# Patient Record
Sex: Female | Born: 1990
Health system: Southern US, Community
[De-identification: ages and names within clinical notes are randomized; demographics above are authoritative.]

## PROBLEM LIST (undated history)

## (undated) ENCOUNTER — Inpatient Hospital Stay (HOSPITAL_COMMUNITY): Payer: Self-pay

## (undated) ENCOUNTER — Emergency Department (HOSPITAL_BASED_OUTPATIENT_CLINIC_OR_DEPARTMENT_OTHER): Payer: Self-pay

## (undated) DIAGNOSIS — S0990XA Unspecified injury of head, initial encounter: Secondary | ICD-10-CM

## (undated) HISTORY — PX: BRAIN SURGERY: SHX531

---

## 2014-12-23 DIAGNOSIS — S0990XA Unspecified injury of head, initial encounter: Secondary | ICD-10-CM

## 2014-12-23 HISTORY — PX: BRAIN SURGERY: SHX531

## 2014-12-23 HISTORY — DX: Unspecified injury of head, initial encounter: S09.90XA

## 2016-02-14 ENCOUNTER — Encounter (HOSPITAL_BASED_OUTPATIENT_CLINIC_OR_DEPARTMENT_OTHER): Payer: Self-pay | Admitting: Emergency Medicine

## 2016-02-14 ENCOUNTER — Emergency Department (HOSPITAL_BASED_OUTPATIENT_CLINIC_OR_DEPARTMENT_OTHER)
Admission: EM | Admit: 2016-02-14 | Discharge: 2016-02-14 | Disposition: A | Discharge status: Home / Self Care | Payer: Self-pay | Attending: Emergency Medicine | Admitting: Emergency Medicine

## 2016-02-14 ENCOUNTER — Emergency Department (HOSPITAL_BASED_OUTPATIENT_CLINIC_OR_DEPARTMENT_OTHER): Payer: Self-pay

## 2016-02-14 DIAGNOSIS — Z8781 Personal history of (healed) traumatic fracture: Secondary | ICD-10-CM | POA: Insufficient documentation

## 2016-02-14 DIAGNOSIS — R1032 Left lower quadrant pain: Secondary | ICD-10-CM | POA: Insufficient documentation

## 2016-02-14 DIAGNOSIS — Z3202 Encounter for pregnancy test, result negative: Secondary | ICD-10-CM | POA: Insufficient documentation

## 2016-02-14 DIAGNOSIS — R1030 Lower abdominal pain, unspecified: Secondary | ICD-10-CM

## 2016-02-14 DIAGNOSIS — M545 Low back pain: Secondary | ICD-10-CM | POA: Insufficient documentation

## 2016-02-14 DIAGNOSIS — R1110 Vomiting, unspecified: Secondary | ICD-10-CM | POA: Insufficient documentation

## 2016-02-14 DIAGNOSIS — N939 Abnormal uterine and vaginal bleeding, unspecified: Secondary | ICD-10-CM | POA: Insufficient documentation

## 2016-02-14 HISTORY — DX: Unspecified injury of head, initial encounter: S09.90XA

## 2016-02-14 LAB — URINE MICROSCOPIC-ADD ON

## 2016-02-14 LAB — WET PREP, GENITAL
Clue Cells Wet Prep HPF POC: NONE SEEN
Sperm: NONE SEEN
Trich, Wet Prep: NONE SEEN
Yeast Wet Prep HPF POC: NONE SEEN

## 2016-02-14 LAB — URINALYSIS, ROUTINE W REFLEX MICROSCOPIC
Bilirubin Urine: NEGATIVE
Glucose, UA: NEGATIVE mg/dL
Ketones, ur: NEGATIVE mg/dL
Nitrite: NEGATIVE
Protein, ur: NEGATIVE mg/dL
Specific Gravity, Urine: 1.024 (ref 1.005–1.030)
pH: 5.5 (ref 5.0–8.0)

## 2016-02-14 LAB — PREGNANCY, URINE: Preg Test, Ur: NEGATIVE

## 2016-02-14 MED ORDER — HYDROCODONE-ACETAMINOPHEN 5-325 MG PO TABS
1.0000 | ORAL_TABLET | Freq: Once | ORAL | Status: AC
  Administered 2016-02-14: 1 via ORAL
  Filled 2016-02-14: qty 1

## 2016-02-14 MED ORDER — HYDROCODONE-ACETAMINOPHEN 5-325 MG PO TABS: 1.0000 | ORAL_TABLET | Freq: Four times a day (QID) | ORAL | Status: DC | PRN

## 2016-02-14 MED FILL — HYDROCODON-APAP 5-325: 5-325 | 2 days supply | Qty: 10 | Fill #0

## 2016-02-14 NOTE — ED Notes (Signed)
Lower abdominal pain with lower back pain since this am.  Vomited x 1.  No fever.  Pt on period.

## 2016-02-14 NOTE — ED Provider Notes (Signed)
CSN: [id number]     Arrival date & time 02/14/16  1018 History   First MD Initiated Contact with Patient 02/14/16 1109     Chief Complaint  Patient presents with  . Abdominal Pain     (Consider location/radiation/quality/duration/timing/severity/associated sxs/prior Treatment) HPI  25 year old female presents with acute left-sided lower abdominal pain and low back pain since this morning. Feels like a cramping sensation. She started her menstrual cycle this morning at the same time. His pain feels similar in quality but more severe than typical period. Patient denies any fevers. Vomited once. No urinary symptoms.  Past Medical History  Diagnosis Date  . Head trauma    Past Surgical History  Procedure Laterality Date  . Brain surgery     No family history on file. Social History  Substance Use Topics  . Smoking status: Never Smoker   . Smokeless tobacco: None  . Alcohol Use: None   OB History    No data available     Review of Systems  Constitutional: Negative for fever.  Gastrointestinal: Positive for vomiting and abdominal pain. Negative for nausea.  Genitourinary: Positive for vaginal bleeding. Negative for dysuria and vaginal discharge.  Musculoskeletal: Positive for back pain.  All other systems reviewed and are negative.     Allergies  Review of patient's allergies indicates no known allergies.  Home Medications   Prior to Admission medications   Not on File   BP 127/89 mmHg  Pulse 69  Temp(Src) 99 F (37.2 C) (Oral)  Resp 18  Ht  (1.626 m)  Wt 160 lb (72.576 kg)  BMI 27.45 kg/m2  SpO2 100%  LMP 02/13/2016 Physical Exam  Constitutional: She is oriented to person, place, and time. She appears well-developed and well-nourished.  HENT:  Head: Normocephalic and atraumatic.  Right Ear: External ear normal.  Left Ear: External ear normal.  Nose: Nose normal.  Eyes: Right eye exhibits no discharge. Left eye exhibits no discharge.  Cardiovascular:  Normal rate, regular rhythm and normal heart sounds.   Pulmonary/Chest: Effort normal and breath sounds normal.  Abdominal: Soft. There is tenderness (mild) in the left lower quadrant. There is no CVA tenderness.  Genitourinary: Uterus is not enlarged. Cervix exhibits no motion tenderness. Right adnexum displays no mass and no tenderness. Left adnexum displays tenderness. Left adnexum displays no mass. There is bleeding in the vagina.  Neurological: She is alert and oriented to person, place, and time.  Skin: Skin is warm and dry.  Nursing note and vitals reviewed.   ED Course  Procedures (including critical care time) Labs Review Labs Reviewed  WET PREP, GENITAL - Abnormal; Notable for the following:    WBC, Wet Prep HPF POC MODERATE (*)    All other components within normal limits  URINALYSIS, ROUTINE W REFLEX MICROSCOPIC (NOT AT [facility]) - Abnormal; Notable for the following:    Color, Urine AMBER (*)    APPearance CLOUDY (*)    Hgb urine dipstick LARGE (*)    Leukocytes, UA SMALL (*)    All other components within normal limits  URINE MICROSCOPIC-ADD ON - Abnormal; Notable for the following:    Squamous Epithelial / LPF 0-5 (*)    Bacteria, UA FEW (*)    All other components within normal limits  PREGNANCY, URINE  GC/CHLAMYDIA PROBE AMP (Bessie) NOT AT [facility]    Imaging Review US Transvaginal Non-ob  02/14/2016  CLINICAL DATA:  25 year old female with left lower quadrant, pelvic pain since this morning.  Started her period yesterday. Initial encounter. EXAM: TRANSABDOMINAL AND TRANSVAGINAL ULTRASOUND OF PELVIS DOPPLER ULTRASOUND OF OVARIES TECHNIQUE: Both transabdominal and transvaginal ultrasound examinations of the pelvis were performed. Transabdominal technique was performed for global imaging of the pelvis including uterus, ovaries, adnexal regions, and pelvic cul-de-sac. It was necessary to proceed with endovaginal exam following the transabdominal exam to visualize the  ovaries. Color and duplex Doppler ultrasound was utilized to evaluate blood flow to the ovaries. COMPARISON:  None. FINDINGS: Uterus Measurements: 6.1 x 2.9 x 4.2 cm. No fibroids or other mass visualized. Endometrium Thickness: 7 mm.  No focal abnormality visualized. Right ovary Measurements: 4.1 x 2.7 x 2.4 cm. 18 mm simple appearing cyst/dominant follicle. Normal appearance/no adnexal mass. Left ovary Measurements: 3.9 x 2.3 x 2.3 cm. Normal appearance/no adnexal mass. Pulsed Doppler evaluation of both ovaries demonstrates normal low-resistance arterial and venous waveforms. Other findings No abnormal free fluid. IMPRESSION: Normal, physiologic ultrasound appearance of the pelvis. No evidence of ovarian torsion. Electronically Signed   By: Odessa Fleming M.D.   On: 02/14/2016 12:49   US Pelvis Complete  02/14/2016  CLINICAL DATA:  25 year old female with left lower quadrant, pelvic pain since this morning. Started her period yesterday. Initial encounter. EXAM: TRANSABDOMINAL AND TRANSVAGINAL ULTRASOUND OF PELVIS DOPPLER ULTRASOUND OF OVARIES TECHNIQUE: Both transabdominal and transvaginal ultrasound examinations of the pelvis were performed. Transabdominal technique was performed for global imaging of the pelvis including uterus, ovaries, adnexal regions, and pelvic cul-de-sac. It was necessary to proceed with endovaginal exam following the transabdominal exam to visualize the ovaries. Color and duplex Doppler ultrasound was utilized to evaluate blood flow to the ovaries. COMPARISON:  None. FINDINGS: Uterus Measurements: 6.1 x 2.9 x 4.2 cm. No fibroids or other mass visualized. Endometrium Thickness: 7 mm.  No focal abnormality visualized. Right ovary Measurements: 4.1 x 2.7 x 2.4 cm. 18 mm simple appearing cyst/dominant follicle. Normal appearance/no adnexal mass. Left ovary Measurements: 3.9 x 2.3 x 2.3 cm. Normal appearance/no adnexal mass. Pulsed Doppler evaluation of both ovaries demonstrates normal  low-resistance arterial and venous waveforms. Other findings No abnormal free fluid. IMPRESSION: Normal, physiologic ultrasound appearance of the pelvis. No evidence of ovarian torsion. Electronically Signed   By: Odessa Fleming M.D.   On: 02/14/2016 12:49   [place] Art/ven Flow Abd Pelv Doppler Limited  02/14/2016  CLINICAL DATA:  25 year old female with left lower quadrant, pelvic pain since this morning. Started her period yesterday. Initial encounter. EXAM: TRANSABDOMINAL AND TRANSVAGINAL ULTRASOUND OF PELVIS DOPPLER ULTRASOUND OF OVARIES TECHNIQUE: Both transabdominal and transvaginal ultrasound examinations of the pelvis were performed. Transabdominal technique was performed for global imaging of the pelvis including uterus, ovaries, adnexal regions, and pelvic cul-de-sac. It was necessary to proceed with endovaginal exam following the transabdominal exam to visualize the ovaries. Color and duplex Doppler ultrasound was utilized to evaluate blood flow to the ovaries. COMPARISON:  None. FINDINGS: Uterus Measurements: 6.1 x 2.9 x 4.2 cm. No fibroids or other mass visualized. Endometrium Thickness: 7 mm.  No focal abnormality visualized. Right ovary Measurements: 4.1 x 2.7 x 2.4 cm. 18 mm simple appearing cyst/dominant follicle. Normal appearance/no adnexal mass. Left ovary Measurements: 3.9 x 2.3 x 2.3 cm. Normal appearance/no adnexal mass. Pulsed Doppler evaluation of both ovaries demonstrates normal low-resistance arterial and venous waveforms. Other findings No abnormal free fluid. IMPRESSION: Normal, physiologic ultrasound appearance of the pelvis. No evidence of ovarian torsion. Electronically Signed   By: Odessa Fleming M.D.   On: 02/14/2016 12:49   I have  personally reviewed and evaluated these images and lab results as part of my medical decision-making.   EKG Interpretation None      MDM   Final diagnoses:  Lower abdominal pain    Patient's lower abdominal pain is most likely related to her menstrual  cycle. No signs of ovarian torsion or ovarian cyst or rupture. Her pain is reproducible on GU exam. Very low suspicion for other abdominal cause of her symptoms such as diverticulitis, renal stone, obstruction, or appendicitis. Has blood in urine but that appears to be from vaginal bleeding. Exam not c/w PID. Vomited once but the nausea has now resolved. Will recommend following up with her GYN and will treat with oral pain control. Discussed strict return precautions.    Pricilla Loveless, MD 02/14/16 [phone]

## 2016-02-15 LAB — GC/CHLAMYDIA PROBE AMP (~~LOC~~) NOT AT ARMC
Chlamydia: NEGATIVE
Neisseria Gonorrhea: NEGATIVE

## 2017-12-23 NOTE — L&D Delivery Note (Addendum)
Delivery Note Pushed very effectively and delivered quickly. Stayed in tub throughout, well controlled.  FOB assisted with delivery, but I was hands-on.  At 6:56 PM a viable and healthy female was delivered via Vaginal, Spontaneous (Presentation:OA ).  APGAR: 8, 9; weight  .   Placenta status:Got patient out of bed for placenta which delivered spontaneously and grossly intact with 3V  Cord:  with the following complications: Nuchal cord x 2.    Anesthesia:  Local for repair.   Episiotomy: None Lacerations: 2nd degree perineal and small left labial Suture Repair: 3.0 vicryl rapide Est. Blood Loss (mL):  300  Mom to postpartum.  Baby to Couplet care / Skin to Skin.  Wynelle BourgeoisMarie [place] 06/29/2018, 7:52 PM  Please schedule this patient for Postpartum visit in: 4 weeks with the following provider: Any provider For C/S patients schedule nurse incision check in weeks 2 weeks: no Low risk pregnancy complicated by: none Delivery mode:  SVD Anticipated Birth Control:  other/unsure PP Procedures needed: none  Schedule Integrated BH visit: no

## 2018-01-13 ENCOUNTER — Encounter: Payer: Self-pay | Admitting: Advanced Practice Midwife

## 2018-01-13 ENCOUNTER — Ambulatory Visit (INDEPENDENT_AMBULATORY_CARE_PROVIDER_SITE_OTHER): Payer: Medicaid Other | Admitting: Advanced Practice Midwife

## 2018-01-13 ENCOUNTER — Other Ambulatory Visit (HOSPITAL_COMMUNITY)
Admission: RE | Admit: 2018-01-13 | Discharge: 2018-01-13 | Disposition: A | Discharge status: Home / Self Care | Payer: Medicaid Other | Source: Ambulatory Visit | Attending: Advanced Practice Midwife | Admitting: Advanced Practice Midwife

## 2018-01-13 VITALS — BP 112/71 | HR 85 | Wt 136.0 lb

## 2018-01-13 DIAGNOSIS — Z3490 Encounter for supervision of normal pregnancy, unspecified, unspecified trimester: Secondary | ICD-10-CM | POA: Insufficient documentation

## 2018-01-13 DIAGNOSIS — Z3400 Encounter for supervision of normal first pregnancy, unspecified trimester: Secondary | ICD-10-CM

## 2018-01-13 DIAGNOSIS — Z3689 Encounter for other specified antenatal screening: Secondary | ICD-10-CM | POA: Diagnosis not present

## 2018-01-13 DIAGNOSIS — Z967 Presence of other bone and tendon implants: Secondary | ICD-10-CM | POA: Insufficient documentation

## 2018-01-13 DIAGNOSIS — L02229 Furuncle of trunk, unspecified: Secondary | ICD-10-CM

## 2018-01-13 DIAGNOSIS — Z3402 Encounter for supervision of normal first pregnancy, second trimester: Secondary | ICD-10-CM | POA: Diagnosis not present

## 2018-01-13 MED ORDER — PRENATAL VITAMINS 0.8 MG PO TABS: 1.0000 | ORAL_TABLET | Freq: Every day | ORAL | 12 refills | Status: DC

## 2018-01-13 NOTE — Progress Notes (Signed)
  Subjective:    Lisa Levy is a G1P0 6962w0d being seen today for her first obstetrical visit.  Her obstetrical history is significant for Primigravida, Desires Waterbirth. Patient does intend to breast feed. Pregnancy history fully reviewed.  Patient reports no complaints.  Vitals:   01/13/18 0855  BP: 112/71  Pulse: 85  Weight: 136 lb (61.7 kg)    HISTORY: OB History  Gravida Para Term Preterm AB Living  1            SAB TAB Ectopic Multiple Live Births               # Outcome Date GA Lbr Len/2nd Weight Sex Delivery Anes PTL Lv  1 Current              Past Medical History:  Diagnosis Date  . Head trauma    Past Surgical History:  Procedure Laterality Date  . BRAIN SURGERY     Family History  Problem Relation Age of Onset  . Cancer Paternal Grandmother   . Diabetes Neg Hx   . Hypertension Neg Hx   . Stroke Neg Hx      Exam    Uterus:     Pelvic Exam:    Perineum: Normal Perineum   Vulva: Bartholin's, Urethra, Skene's normal, Has draining lesion on right labia x 3 weeks, gets red/swollen then drains   Now is 3cm long, .5cm wide, small opening at bottom draining serous fluid, no erethema or induration, superficial   Vagina:  normal discharge   pH:    Cervix: no bleeding following Pap and nulliparous appearance   Adnexa: no mass, fullness, tenderness   Bony Pelvis: gynecoid  System: Breast:  normal appearance, no masses or tenderness, No nipple retraction or dimpling, No axillary or supraclavicular adenopathy, Normal to palpation without dominant masses   Skin: normal coloration and turgor, no rashes    Neurologic: oriented, normal mood, grossly non-focal   Extremities: normal strength, tone, and muscle mass   HEENT neck supple with midline trachea   Mouth/Teeth mucous membranes moist, pharynx normal without lesions   Neck supple and no masses   Cardiovascular: regular rate and rhythm, no murmurs or gallops   Respiratory:  appears well, vitals normal, no  respiratory distress, acyanotic, normal RR, ear and throat exam is normal, neck free of mass or lymphadenopathy, chest clear, no wheezing, crepitations, rhonchi, normal symmetric air entry   Abdomen: soft, non-tender; bowel sounds normal; no masses,  no organomegaly   Urinary: urethral meatus normal      Assessment:    Pregnancy: G1P0 Patient Active Problem List   Diagnosis Date Noted  . Supervision of normal first pregnancy, antepartum 01/13/2018  . Metal plate in skull 08/65/784601/22/2019        Plan:    Routines reviewed Welcomed to practice. Discussed fellow, residents and students and how our team works together. Initial labs drawn. Prenatal vitamins. Problem list reviewed and updated. Genetic Screening discussed Integrated Screen and Quad Screen: declined.  Ultrasound discussed; fetal survey: ordered.  Follow up in 4 weeks. 50% of 30 min visit spent on counseling and coordination of care.  Discussed Babys[employer]s app Discussed waterbirth. Has taken class. Need to copy certificate.  Discussed options for tub. Discussed doula support.   Wynelle BourgeoisMarie Recia Sons 01/13/2018

## 2018-01-13 NOTE — Progress Notes (Signed)
DATING AND VIABILITY SONOGRAM   Lisa Levy is a 27 y.o. year old G1P0 with LMP Patient's last menstrual period was 10/17/2017 (approximate). which would correlate to  4526w4d weeks gestation.  She has regular menstrual cycles.   She is here today for a confirmatory initial sonogram.    GESTATION: SINGLETON   FETAL ACTIVITY:          Heart rate         138          The fetus is active.    GESTATIONAL AGE AND  BIOMETRICS:  Gestational criteria: Estimated Date of Delivery: 07/24/18 by LMP now at 5226w4d  Previous Scans:0  Head circumferenc 13.9 cm 17-2 weeks  Femur length 1.88 vm 15-3 weeks                                                                               AVERAGE EGA(BY THIS SCAN):  16 weeks  WORKING EDD(2nd trimester ultrasound):  16 weeks. Patient will be schedule for anatomy ultrasound in two weeks.   Armandina StammerJennifer Howard 01/13/2018 9:15 AM

## 2018-01-13 NOTE — Patient Instructions (Signed)
Thinking About Doren Custard???  You must attend a Doren Custard class at [facility]  3rd Wednesday of every month from 7-9pm  [employer] by calling [phone] or online at [email or web address]  Bring [place] the certificate from the class  Waterbirth supplies needed for [employer] patients:  Our practice has a Heritage manager in a Box tub at the hospital that you can borrow  You will need to purchase an accessory kit that has all needed supplies through [employer] (  ) or online  Or you can purchase the supplies separately: o Single-use disposable tub liner for [employer] in a Box (REGULAR size) o New garden hose labeled "lead-free", "suitable for drinking water", "non-toxic" OR "water potable" o Garden hose to remove the dirty water o Electric drain pump to remove water (We recommend 792 gallon per hour or greater pump.)  o Fish net o Bathing suit top (optional) o Long-handled mirror (optional)  [employer] sells tubs for ~ $120 if you would rather purchase your own tub  [facility] [employer] [facility] (www.thelaborladies.com) $275 for tub rental/set-up & take down/kit   Things that would prevent you from having a waterbirth:  Premature, <37wks  Previous cesarean birth  Presence of thick meconium-stained fluid  Multiple gestation (Twins, triplets, etc.)  Uncontrolled diabetes  Hypertension  Heavy vaginal bleeding  Non-reassuring fetal heart rate  Active infection (MRSA, etc.)  If your labor has to be induced  Other risk issues identified by your obstetrical provider Thinking About Doren Custard???  You must attend a Doren Custard class at [facility]  3rd Wednesday of every month from 7-9pm  [employer] by calling [phone] or online at [email or web address]  Bring [place] the certificate from the class  Waterbirth supplies needed for [employer] patients:  Our practice has a Occupational psychologist in a Box tub at the hospital that you can borrow  You will need to purchase an accessory kit that has all needed supplies through [employer] (  ) or online  Or you can purchase the supplies separately: o Single-use disposable tub liner for [employer] in a Box (REGULAR size) o New garden hose labeled "lead-free", "suitable for drinking water", "non-toxic" OR "water potable" o Garden hose to remove the dirty water o Electric drain pump to remove water (We recommend 792 gallon per hour or greater pump.)  o Fish net o Bathing suit top (optional) o Long-handled mirror (optional)  [employer] sells tubs for ~ $120 if you would rather purchase your own tub  [facility] (www.thelaborladies.com) $275 for tub rental/set-up & take down/kit   Things that would prevent you from having a waterbirth:  Premature, <37wks  Previous cesarean birth  Presence of thick meconium-stained fluid  Multiple gestation (Twins, triplets, etc.)  Uncontrolled diabetes  Hypertension  Heavy vaginal bleeding  Non-reassuring fetal heart rate  Active infection (MRSA, etc.)  If your labor has to be induced  Other risk issues identified by your obstetrical provider  Considering Doren Custard? Guide for patients at [facility] [employer] [facility]  Why consider waterbirth?  . Gentle birth for babies . Less pain medicine used in labor . May allow for passive descent/less pushing . May reduce perineal tears  . More mobility and instinctive maternal position changes . Increased maternal relaxation . Reduced blood pressure in labor  Is waterbirth safe? What are the risks of infection, drowning or other complications?  . Infection: o Very low risk (3.7 % for tub vs 4.8% for bed) o  7 in 80 waterbirths with documented infection o Poorly cleaned equipment most common cause o Slightly lower group B strep transmission rate  . Drowning o Maternal:  - Very low risk    - Related to seizures or fainting o Newborn:  - Very low risk. No evidence of increased risk of respiratory problems in multiple large studies - Physiological protection from breathing under water - Avoid underwater birth if there are any fetal complications - Once baby's head is out of the water, keep it out.  . Birth complication o Some reports of cord trauma, but risk decreased by bringing baby to surface gradually o No evidence of increased risk of shoulder dystocia. Mothers can usually change positions faster in water than in a bed, possibly aiding the maneuvers to free the shoulder.   Am I a candidate for waterbirth?  Yes, if you are: . Full-term (37 weeks or greater)  . Have had an uncomplicated pregnancy and labor  No, if you have: Marland Kitchen Preterm birth less than 37 weeks . Thick, particulate meconium stained fluid . Maternal fever over 101 . Heavy bleeding or signs of placental abruption . Pre-eclampsia  . Any abnormal fetal heart rate pattern . Breech presentation . Twins  . Very large baby . Active communicable infection (this does NOT include group B strep) . Significant limitation to mobility  Please remember that birth is unpredictable. Under certain unforeseeable circumstances your provider may advise against giving birth in the tub. These decisions will be made on a case-by-case basis and with the safety of you and your baby as our highest priority.  Requirements for patients planning waterbirth  . Ask your midwife if you will be a candidate for waterbirth. . Attend the Barney Drain at [employer] at [phone] or [phone] for dates and times. The class is free and we strongly encourage you to bring your support person. You will receive a certificate of participation to show to your midwife or doctor. . Supplies o Waterbirth tub (NOT kiddie pool) o Architectural technologist pump to inflate tub (manual-foot  pump or electric) o New garden hose labeled "lead-free", "suitable for drinking water", "non-toxic" OR "water potable" o Faucet adaptor to attach hose to faucet         o Electric drain pump to remove water (We recommend 792 gallon per hour or greater pump.)  o Water thermometer (baby store / pool supplies) o Occupational psychologist top (optional) o Long-handled mirror (optional)   The above information was reviewed with the patient and she verbally consented and acknowledged the eligibility criteria as well as contraindications and procedures for both labor and birth. The patient also acknowledged that she has been informed that during the course of labor and birth unforeseen conditions may occur which may require her to leave the water. The patients agrees to follow the instructions from the nurse, nurse midwife and/or physician including getting out of the tub if deemed medically necessary.   Second Trimester of Pregnancy The second trimester is from week 13 through week 28, month 4 through 6. This is often the time in pregnancy that you feel your best. Often times, morning sickness has lessened or quit. You may have more energy, and you may get hungry more often. Your unborn baby (fetus) is growing rapidly. At the end of the sixth month, he or she is about 9 inches long and weighs about 1 pounds. You will likely feel the  baby move (quickening) between 18 and 20 weeks of pregnancy. Follow these instructions at home:  Avoid all smoking, herbs, and alcohol. Avoid drugs not approved by your doctor.  Do not use any tobacco products, including cigarettes, chewing tobacco, and electronic cigarettes. If you need help quitting, ask your doctor. You may get counseling or other support to help you quit.  Only take medicine as told by your doctor. Some medicines are safe and some are not during pregnancy.  Exercise only as told by your doctor. Stop exercising if you start having cramps.  Eat  regular, healthy meals.  Wear a good support bra if your breasts are tender.  Do not use hot tubs, steam rooms, or saunas.  Wear your seat belt when driving.  Avoid raw meat, uncooked cheese, and liter boxes and soil used by cats.  Take your prenatal vitamins.  Take 1500-2000 milligrams of calcium daily starting at the 20th week of pregnancy until you deliver your baby.  Try taking medicine that helps you poop (stool softener) as needed, and if your doctor approves. Eat more fiber by eating fresh fruit, vegetables, and whole grains. Drink enough fluids to keep your pee (urine) clear or pale yellow.  Take warm water baths (sitz baths) to soothe pain or discomfort caused by hemorrhoids. Use hemorrhoid cream if your doctor approves.  If you have puffy, bulging veins (varicose veins), wear support hose. Raise (elevate) your feet for 15 minutes, 3-4 times a day. Limit salt in your diet.  Avoid heavy lifting, wear low heals, and sit up straight.  Rest with your legs raised if you have leg cramps or low back pain.  Visit your dentist if you have not gone during your pregnancy. Use a soft toothbrush to brush your teeth. Be gentle when you floss.  You can have sex (intercourse) unless your doctor tells you not to.  Go to your doctor visits. Get help if:  You feel dizzy.  You have mild cramps or pressure in your lower belly (abdomen).  You have a nagging pain in your belly area.  You continue to feel sick to your stomach (nauseous), throw up (vomit), or have watery poop (diarrhea).  You have bad smelling fluid coming from your vagina.  You have pain with peeing (urination). Get help right away if:  You have a fever.  You are leaking fluid from your vagina.  You have spotting or bleeding from your vagina.  You have severe belly cramping or pain.  You lose or gain weight rapidly.  You have trouble catching your breath and have chest pain.  You notice sudden or extreme  puffiness (swelling) of your face, hands, ankles, feet, or legs.  You have not felt the baby move in over an hour.  You have severe headaches that do not go away with medicine.  You have vision changes.    This information is not intended to replace advice give

## 2018-01-13 NOTE — Progress Notes (Signed)
Patient report fetal movement for about three weeks. Armandina StammerJennifer Aamari Strawderman RN

## 2018-01-14 LAB — CYTOLOGY - PAP
CHLAMYDIA, DNA PROBE: NEGATIVE
Diagnosis: NEGATIVE
Neisseria Gonorrhea: NEGATIVE

## 2018-01-14 LAB — URINE CULTURE

## 2018-01-16 ENCOUNTER — Encounter (HOSPITAL_COMMUNITY): Payer: Self-pay | Admitting: Advanced Practice Midwife

## 2018-01-21 LAB — OBSTETRIC PANEL, INCLUDING HIV
Antibody Screen: NEGATIVE
BASOS ABS: 0 10*3/uL (ref 0.0–0.2)
Basos: 0 %
EOS (ABSOLUTE): 0 10*3/uL (ref 0.0–0.4)
Eos: 0 %
HIV Screen 4th Generation wRfx: NONREACTIVE
Hematocrit: 34.3 % (ref 34.0–46.6)
Hemoglobin: 11.3 g/dL (ref 11.1–15.9)
Hepatitis B Surface Ag: NEGATIVE
IMMATURE GRANS (ABS): 0 10*3/uL (ref 0.0–0.1)
IMMATURE GRANULOCYTES: 0 %
LYMPHS: 21 %
Lymphocytes Absolute: 1.7 10*3/uL (ref 0.7–3.1)
MCH: 31.3 pg (ref 26.6–33.0)
MCHC: 32.9 g/dL (ref 31.5–35.7)
MCV: 95 fL (ref 79–97)
MONOS ABS: 0.4 10*3/uL (ref 0.1–0.9)
Monocytes: 4 %
NEUTROS PCT: 75 %
Neutrophils Absolute: 6 10*3/uL (ref 1.4–7.0)
PLATELETS: 268 10*3/uL (ref 150–379)
RBC: 3.61 x10E6/uL — AB (ref 3.77–5.28)
RDW: 13 % (ref 12.3–15.4)
RPR Ser Ql: NONREACTIVE
Rh Factor: POSITIVE
Rubella Antibodies, IGG: 1.39 index (ref >0.99)
WBC: 8 10*3/uL (ref 3.4–10.8)

## 2018-01-21 LAB — CYSTIC FIBROSIS MUTATION 97: GENE DIS ANAL CARRIER INTERP BLD/T-IMP: NOT DETECTED

## 2018-01-21 LAB — VITAMIN D 25 HYDROXY (VIT D DEFICIENCY, FRACTURES): VIT D 25 HYDROXY: 26.1 ng/mL — AB (ref 30.0–100.0)

## 2018-01-27 ENCOUNTER — Ambulatory Visit (HOSPITAL_COMMUNITY)
Admission: RE | Admit: 2018-01-27 | Discharge: 2018-01-27 | Disposition: A | Discharge status: Home / Self Care | Payer: Medicaid Other | Source: Ambulatory Visit | Attending: Advanced Practice Midwife | Admitting: Advanced Practice Midwife

## 2018-01-27 ENCOUNTER — Ambulatory Visit (HOSPITAL_COMMUNITY): Payer: Medicaid Other

## 2018-01-27 ENCOUNTER — Other Ambulatory Visit: Payer: Self-pay | Admitting: Advanced Practice Midwife

## 2018-01-27 DIAGNOSIS — O09892 Supervision of other high risk pregnancies, second trimester: Secondary | ICD-10-CM | POA: Insufficient documentation

## 2018-01-27 DIAGNOSIS — Z3A18 18 weeks gestation of pregnancy: Secondary | ICD-10-CM | POA: Diagnosis present

## 2018-01-27 DIAGNOSIS — O321XX0 Maternal care for breech presentation, not applicable or unspecified: Secondary | ICD-10-CM | POA: Insufficient documentation

## 2018-01-27 DIAGNOSIS — Z3490 Encounter for supervision of normal pregnancy, unspecified, unspecified trimester: Secondary | ICD-10-CM

## 2018-01-27 DIAGNOSIS — Z3689 Encounter for other specified antenatal screening: Secondary | ICD-10-CM | POA: Diagnosis present

## 2018-02-13 ENCOUNTER — Ambulatory Visit (INDEPENDENT_AMBULATORY_CARE_PROVIDER_SITE_OTHER): Payer: Medicaid Other | Admitting: Family Medicine

## 2018-02-13 DIAGNOSIS — Z3400 Encounter for supervision of normal first pregnancy, unspecified trimester: Secondary | ICD-10-CM

## 2018-02-13 NOTE — Patient Instructions (Signed)
Second Trimester of Pregnancy The second trimester is from week 13 through week 28, month 4 through 6. This is often the time in pregnancy that you feel your best. Often times, morning sickness has lessened or quit. You may have more energy, and you may get hungry more often. Your unborn baby (fetus) is growing rapidly. At the end of the sixth month, he or she is about 9 inches long and weighs about 1 pounds. You will likely feel the baby move (quickening) between 18 and 20 weeks of pregnancy. Follow these instructions at home:  Avoid all smoking, herbs, and alcohol. Avoid drugs not approved by your doctor.  Do not use any tobacco products, including cigarettes, chewing tobacco, and electronic cigarettes. If you need help quitting, ask your doctor. You may get counseling or other support to help you quit.  Only take medicine as told by your doctor. Some medicines are safe and some are not during pregnancy.  Exercise only as told by your doctor. Stop exercising if you start having cramps.  Eat regular, healthy meals.  Wear a good support bra if your breasts are tender.  Do not use hot tubs, steam rooms, or saunas.  Wear your seat belt when driving.  Avoid raw meat, uncooked cheese, and liter boxes and soil used by cats.  Take your prenatal vitamins.  Take 1500-2000 milligrams of calcium daily starting at the 20th week of pregnancy until you deliver your baby.  Try taking medicine that helps you poop (stool softener) as needed, and if your doctor approves. Eat more fiber by eating fresh fruit, vegetables, and whole grains. Drink enough fluids to keep your pee (urine) clear or pale yellow.  Take warm water baths (sitz baths) to soothe pain or discomfort caused by hemorrhoids. Use hemorrhoid cream if your doctor approves.  If you have puffy, bulging veins (varicose veins), wear support hose. Raise (elevate) your feet for 15 minutes, 3-4 times a day. Limit salt in your diet.  Avoid heavy  lifting, wear low heals, and sit up straight.  Rest with your legs raised if you have leg cramps or low back pain.  Visit your dentist if you have not gone during your pregnancy. Use a soft toothbrush to brush your teeth. Be gentle when you floss.  You can have sex (intercourse) unless your doctor tells you not to.  Go to your doctor visits. Get help if:  You feel dizzy.  You have mild cramps or pressure in your lower belly (abdomen).  You have a nagging pain in your belly area.  You continue to feel sick to your stomach (nauseous), throw up (vomit), or have watery poop (diarrhea).  You have bad smelling fluid coming from your vagina.  You have pain with peeing (urination). Get help right away if:  You have a fever.  You are leaking fluid from your vagina.  You have spotting or bleeding from your vagina.  You have severe belly cramping or pain.  You lose or gain weight rapidly.  You have trouble catching your breath and have chest pain.  You notice sudden or extreme puffiness (swelling) of your face, hands, ankles, feet, or legs.  You have not felt the baby move in over an hour.  You have severe headaches that do not go away with medicine.  You have vision changes. This information is not intended to replace advice given to you by your health care provider. Make sure you discuss any questions you have with your health care   provider. Document Released: 03/05/2010 Document Revised: 05/16/2016 Document Reviewed: 02/09/2013 Elsevier Interactive Patient Education  2017 [employer]  

## 2018-02-13 NOTE — Progress Notes (Signed)
   PRENATAL VISIT NOTE  Subjective:  Lisa Levy is a 27 y.o. G1P0 at 6645w3d being seen today for ongoing prenatal care.  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  Patient reports no complaints.  Contractions: Not present. Vag. Bleeding: None.  Movement: Present. Denies leaking of fluid.   The following portions of the patient's history were reviewed and updated as appropriate: allergies, current medications, past family history, past medical history, past social history, past surgical history and problem list. Problem list updated.  Objective:   Vitals:   02/13/18 0844  BP: (!) 112/59  Pulse: 80  Weight: 146 lb (66.2 kg)    Fetal Status: Fetal Heart Rate (bpm): 145 Fundal Height: 20 cm Movement: Present     General:  Alert, oriented and cooperative. Patient is in no acute distress.  Skin: Skin is warm and dry. No rash noted.   Cardiovascular: Normal heart rate noted  Respiratory: Normal respiratory effort, no problems with respiration noted  Abdomen: Soft, gravid, appropriate for gestational age.  Pain/Pressure: Absent     Pelvic: Cervical exam deferred        Extremities: Normal range of motion.     Mental Status:  Normal mood and affect. Normal behavior. Normal judgment and thought content.   Assessment and Plan:  Pregnancy: G1P0 at 8745w3d  1. Supervision of normal first pregnancy, antepartum FHT and FH normal  Preterm labor symptoms and general obstetric precautions including but not limited to vaginal bleeding, contractions, leaking of fluid and fetal movement were reviewed in detail with the patient. Please refer to After Visit Summary for other counseling recommendations.  Return in about 4 weeks (around 03/13/2018) for OB f/u.   Levie HeritageJacob J Stinson, DO

## 2018-03-13 ENCOUNTER — Ambulatory Visit (INDEPENDENT_AMBULATORY_CARE_PROVIDER_SITE_OTHER): Payer: Medicaid Other | Admitting: Family Medicine

## 2018-03-13 DIAGNOSIS — Z3400 Encounter for supervision of normal first pregnancy, unspecified trimester: Secondary | ICD-10-CM

## 2018-03-13 NOTE — Addendum Note (Signed)
Addended by: Anell BarrHOWARD, JENNIFER L on: 03/13/2018 08:54 AM   Modules accepted: Orders

## 2018-03-13 NOTE — Progress Notes (Signed)
   PRENATAL VISIT NOTE  Subjective:  Lisa Levy is a 27 y.o. G1P0 at 7484w3d being seen today for ongoing prenatal care.  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  Patient reports no complaints.  Contractions: Not present. Vag. Bleeding: None.  Movement: Present. Denies leaking of fluid.   The following portions of the patient's history were reviewed and updated as appropriate: allergies, current medications, past family history, past medical history, past social history, past surgical history and problem list. Problem list updated.  Objective:   Vitals:   03/13/18 0827  BP: 113/64  Pulse: 83  Weight: 158 lb (71.7 kg)    Fetal Status: Fetal Heart Rate (bpm): 140 Fundal Height: 24 cm Movement: Present     General:  Alert, oriented and cooperative. Patient is in no acute distress.  Skin: Skin is warm and dry. No rash noted.   Cardiovascular: Normal heart rate noted  Respiratory: Normal respiratory effort, no problems with respiration noted  Abdomen: Soft, gravid, appropriate for gestational age.  Pain/Pressure: Absent     Pelvic: Cervical exam deferred        Extremities: Normal range of motion.     Mental Status:  Normal mood and affect. Normal behavior. Normal judgment and thought content.   Assessment and Plan:  Pregnancy: G1P0 at 3484w3d  1. Supervision of normal first pregnancy, antepartum FHT and FH normal. Took waterbirth class. 28 wk labs next visit  Preterm labor symptoms and general obstetric precautions including but not limited to vaginal bleeding, contractions, leaking of fluid and fetal movement were reviewed in detail with the patient. Please refer to After Visit Summary for other counseling recommendations.  Return in about 1 month (around 04/10/2018) for OB f/u, 2 hr GTT.   Levie HeritageJacob J Stinson, DO

## 2018-04-13 ENCOUNTER — Encounter: Payer: Self-pay | Admitting: Obstetrics & Gynecology

## 2018-04-13 ENCOUNTER — Ambulatory Visit (INDEPENDENT_AMBULATORY_CARE_PROVIDER_SITE_OTHER): Payer: Medicaid Other | Admitting: Obstetrics & Gynecology

## 2018-04-13 VITALS — BP 122/71 | HR 91 | Wt 166.0 lb

## 2018-04-13 DIAGNOSIS — Z3490 Encounter for supervision of normal pregnancy, unspecified, unspecified trimester: Secondary | ICD-10-CM

## 2018-04-13 DIAGNOSIS — Z3493 Encounter for supervision of normal pregnancy, unspecified, third trimester: Secondary | ICD-10-CM

## 2018-04-13 DIAGNOSIS — Z3400 Encounter for supervision of normal first pregnancy, unspecified trimester: Secondary | ICD-10-CM

## 2018-04-13 DIAGNOSIS — Z967 Presence of other bone and tendon implants: Secondary | ICD-10-CM

## 2018-04-13 NOTE — Progress Notes (Signed)
   PRENATAL VISIT NOTE  Subjective:  Oliver Barrerin Kopecky is a 27 y.o. G1P0 at 7562w6d being seen today for ongoing prenatal care.  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  Patient reports no complaints.  Contractions: Not present. Vag. Bleeding: None.  Movement: Present. Denies leaking of fluid.   The following portions of the patient's history were reviewed and updated as appropriate: allergies, current medications, past family history, past medical history, past social history, past surgical history and problem list. Problem list updated.  Objective:   Vitals:   04/13/18 0900  BP: 122/71  Pulse: 91  Weight: 166 lb (75.3 kg)    Fetal Status:     Movement: Present     General:  Alert, oriented and cooperative. Patient is in no acute distress.  Skin: Skin is warm and dry. No rash noted.   Cardiovascular: Normal heart rate noted  Respiratory: Normal respiratory effort, no problems with respiration noted  Abdomen: Soft, gravid, appropriate for gestational age.  Pain/Pressure: Absent     Pelvic: Cervical exam deferred        Extremities: Normal range of motion.  Edema: None  Mental Status: Normal mood and affect. Normal behavior. Normal judgment and thought content.   Assessment and Plan:  Pregnancy: G1P0 at 7062w6d  1. Prenatal care, antepartum - Glucose Tolerance, 2 Hours w/1 Hour - RPR - HIV antibody (with reflex) - CBC  2. Supervision of normal first pregnancy, antepartum  3. 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 Please refer to After Visit Summary for other counseling recommendations.  Return in about 2 weeks (around 04/27/2018).  No future  appointments.  Willodean Rosenthalarolyn Harraway-Smith, MD

## 2018-04-13 NOTE — Patient Instructions (Signed)

## 2018-04-14 LAB — CBC
HEMATOCRIT: 35.2 % (ref 34.0–46.6)
Hemoglobin: 11.6 g/dL (ref 11.1–15.9)
MCH: 31.8 pg (ref 26.6–33.0)
MCHC: 33 g/dL (ref 31.5–35.7)
MCV: 96 fL (ref 79–97)
PLATELETS: 243 10*3/uL (ref 150–379)
RBC: 3.65 x10E6/uL — ABNORMAL LOW (ref 3.77–5.28)
RDW: 13.7 % (ref 12.3–15.4)
WBC: 9.1 10*3/uL (ref 3.4–10.8)

## 2018-04-14 LAB — GLUCOSE TOLERANCE, 2 HOURS W/ 1HR
GLUCOSE, 1 HOUR: 69 mg/dL (ref 65–179)
GLUCOSE, 2 HOUR: 74 mg/dL (ref 65–152)
GLUCOSE, FASTING: 71 mg/dL (ref 65–91)

## 2018-04-14 LAB — RPR: RPR Ser Ql: NONREACTIVE

## 2018-04-14 LAB — HIV ANTIBODY (ROUTINE TESTING W REFLEX): HIV SCREEN 4TH GENERATION: NONREACTIVE

## 2018-04-27 ENCOUNTER — Ambulatory Visit (INDEPENDENT_AMBULATORY_CARE_PROVIDER_SITE_OTHER): Payer: Medicaid Other | Admitting: Family Medicine

## 2018-04-27 ENCOUNTER — Encounter: Payer: Self-pay | Admitting: Family Medicine

## 2018-04-27 VITALS — BP 120/71 | HR 94 | Wt 171.0 lb

## 2018-04-27 DIAGNOSIS — Z3400 Encounter for supervision of normal first pregnancy, unspecified trimester: Secondary | ICD-10-CM

## 2018-04-27 NOTE — Progress Notes (Addendum)
   PRENATAL VISIT NOTE  Subjective:  Lisa Levy is a 27 y.o. G1P0 at [redacted]w[redacted]d being seen today for ongoing prenatal care.  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  Patient reports no complaints.  Contractions: Not present. Vag. Bleeding: None.  Movement: Present. Denies leaking of fluid.   The following portions of the patient's history were reviewed and updated as appropriate: allergies, current medications, past family history, past medical history, past social history, past surgical history and problem list. Problem list updated.  Objective:   Vitals:   04/27/18 0834  BP: 120/71  Pulse: 94  Weight: 171 lb (77.6 kg)    Fetal Status: Fetal Heart Rate (bpm): 156   Movement: Present     General:  Alert, oriented and cooperative. Patient is in no acute distress.  Skin: Skin is warm and dry. No rash noted.   Cardiovascular: Normal heart rate noted  Respiratory: Normal respiratory effort, no problems with respiration noted  Abdomen: Soft, gravid, appropriate for gestational age.  Pain/Pressure: Present     Pelvic: Cervical exam deferred        Extremities: Normal range of motion.  Edema: None  Mental Status: Normal mood and affect. Normal behavior. Normal judgment and thought content.   Assessment and Plan:  Pregnancy: G1P0 at [redacted]w[redacted]d  1. Supervision of normal first pregnancy, antepartum FHT and FH normal  Preterm labor symptoms and general obstetric precautions including but not limited to vaginal bleeding, contractions, leaking of fluid and fetal movement were reviewed in detail with the patient. Please refer to After Visit Summary for other counseling recommendations.  Return in about 2 weeks (around 05/11/2018) for OB f/u.  No future appointments.  Levie Heritage, DO

## 2018-05-11 ENCOUNTER — Ambulatory Visit (INDEPENDENT_AMBULATORY_CARE_PROVIDER_SITE_OTHER): Payer: Medicaid Other | Admitting: Obstetrics & Gynecology

## 2018-05-11 VITALS — BP 121/87 | HR 107 | Wt 174.0 lb

## 2018-05-11 DIAGNOSIS — Z967 Presence of other bone and tendon implants: Secondary | ICD-10-CM

## 2018-05-11 DIAGNOSIS — Z3403 Encounter for supervision of normal first pregnancy, third trimester: Secondary | ICD-10-CM

## 2018-05-11 DIAGNOSIS — Z3400 Encounter for supervision of normal first pregnancy, unspecified trimester: Secondary | ICD-10-CM

## 2018-05-11 NOTE — Progress Notes (Signed)
   PRENATAL VISIT NOTE  Subjective:  Lisa Levy is a 27 y.o. G1P0 at [redacted]w[redacted]d being seen today for ongoing prenatal care.  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  Patient reports poison ivy on extremities.  Contractions: Not present. Vag. Bleeding: None.  Movement: Present. Denies leaking of fluid.   The following portions of the patient's history were reviewed and updated as appropriate: allergies, current medications, past family history, past medical history, past social history, past surgical history and problem list. Problem list updated.  Objective:   Vitals:   05/11/18 1116  BP: 121/87  Pulse: (!) 107  Weight: 174 lb (78.9 kg)    Fetal Status: Fetal Heart Rate (bpm): 135   Movement: Present     General:  Alert, oriented and cooperative. Patient is in no acute distress.  Skin: Skin is warm and dry. No rash noted.   Cardiovascular: Normal heart rate noted  Respiratory: Normal respiratory effort, no problems with respiration noted  Abdomen: Soft, gravid, appropriate for gestational age.  Pain/Pressure: Present     Pelvic: Cervical exam deferred        Extremities: Normal range of motion.  Edema: None  Mental Status: Normal mood and affect. Normal behavior. Normal judgment and thought content.   Assessment and Plan:  Pregnancy: G1P0 at [redacted]w[redacted]d  1. Supervision of normal first pregnancy, antepartum doing well  2. 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 Please refer to After Visit Summary for other counseling recommendations.  Return in about 2 weeks (around 05/25/2018).  No future appointments.  Willodean Rosenthal, MD

## 2018-05-25 ENCOUNTER — Encounter: Payer: Self-pay | Admitting: Obstetrics & Gynecology

## 2018-05-25 ENCOUNTER — Ambulatory Visit (INDEPENDENT_AMBULATORY_CARE_PROVIDER_SITE_OTHER): Payer: Medicaid Other | Admitting: Obstetrics & Gynecology

## 2018-05-25 ENCOUNTER — Encounter: Payer: Medicaid Other | Admitting: Obstetrics & Gynecology

## 2018-05-25 VITALS — BP 117/73 | HR 95 | Wt 179.0 lb

## 2018-05-25 DIAGNOSIS — Z967 Presence of other bone and tendon implants: Secondary | ICD-10-CM

## 2018-05-25 DIAGNOSIS — Z3400 Encounter for supervision of normal first pregnancy, unspecified trimester: Secondary | ICD-10-CM

## 2018-05-25 DIAGNOSIS — Z23 Encounter for immunization: Secondary | ICD-10-CM | POA: Diagnosis not present

## 2018-05-25 NOTE — Patient Instructions (Signed)
Natural Childbirth Natural childbirth is going through labor and delivery without any drugs to relieve pain. Additionally, fetal monitors are not used, a delivery is not done, and a surgical cut to enlarge the vaginal opening (episiotomy) is not made. With the help of a birthing professional (midwife or health care provider), you direct your own labor and delivery. Many women choose natural childbirth because it makes them feel more in control and in touch with their labor and delivery. Some woman also choose natural childbirth because they are concerned about medicines affecting them and their babies. Pregnant women with a high-risk pregnancy should not attempt natural childbirth. It is better to deliver the infant in a hospital if an emergency situation arises. Sometimes, a health care provider has to get involved for the health and safety of the mother and infant. Techniques for natural childbirth  The Lamaze method-This method teaches parents that having a baby is normal, healthy, and natural. It also teaches the mother to take a neutral position regarding pain medicine and numbing medicines and to make an informed decision about using these medicines when the time comes.  The Bradley method (also called husband-coached birth)-This method teaches the father or partner to be the birth coach. It encourages the mother to exercise and eat a balanced, nutritious diet. It also involves relaxation and deep breathing exercises and preparing the parents for emergency situations. Methods of dealing with labor pain and delivery  Meditation.  Yoga.  Hypnosis.  Acupuncture.  Massage.  Changing positions (walking, rocking, showering, leaning on birth balls).  Lying in warm water or a whirlpool bath.  Finding an activity that keeps your mind off of the labor pain.  Listening to soft music.  Focusing on a particular object (visual imagery). Before going into labor  Be sure you and your spouse or  partner are in agreement about having a natural childbirth.  Decide if your health care provider or a midwife will deliver your baby.  Decide if you will have your baby in the hospital, at a birthing center, or at home.  If you have children, make plans to have someone take care of them when you go to the hospital or birthing center.  Know the distance and the time it takes to go to the hospital or birthing center. Practice going there and time it to be sure.  Have a bag packed with a nightgown, bathrobe, and toiletries. Be ready to take it with you when you go into labor.  Keep phone numbers of your family and friends handy if you need to call someone when you go into labor.  Your spouse or partner should go to all the natural childbirth technique classes.  Talk with your health care provider about the possibility of a medical emergency and what will happen if that occurs. Advantages of natural childbirth  You are in control of your labor and delivery.  You will not take medicines that could affect you and the baby.  There are no invasive procedures, such as an episiotomy.  You and your spouse or partner will work together, which can increase your bond with each other.  In most delivery centers, your family and friends can be involved in the labor and delivery process. Disadvantages of natural childbirth  You will experience pain during your labor and delivery.  The methods of helping relieve your labor pains may not work for you.  You may feel disappointed if you decide to change your mind during labor and not   have a natural childbirth. After the delivery  You will be very tired.  You will be uncomfortable because of your uterus contracting. You will feel soreness around the vagina.  You may feel cold and shaky. This is a natural reaction. This information is not intended to replace advice given to you by your health care provider. Make sure you discuss any questions you  have with your health care provider. Document Released: 11/21/2008 Document Revised: 05/16/2016 Document Reviewed: 08/16/2013 Elsevier Interactive Patient Education  2017 [employer] Inc.  

## 2018-05-25 NOTE — Progress Notes (Signed)
   PRENATAL VISIT NOTE  Subjective:  Lisa Levy is a 27 y.o. G1P0 at 7246w6d being seen today for ongoing prenatal care.  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  Patient reports no complaints.  Contractions: Not present. Vag. Bleeding: None.  Movement: Present. Denies leaking of fluid.   The following portions of the patient's history were reviewed and updated as appropriate: allergies, current medications, past family history, past medical history, past social history, past surgical history and problem list. Problem list updated.  Objective:   Vitals:   05/25/18 0914  BP: 117/73  Pulse: 95  Weight: 179 lb (81.2 kg)    Fetal Status: Fetal Heart Rate (bpm): 145   Movement: Present     General:  Alert, oriented and cooperative. Patient is in no acute distress.  Skin: Skin is warm and dry. No rash noted.   Cardiovascular: Normal heart rate noted  Respiratory: Normal respiratory effort, no problems with respiration noted  Abdomen: Soft, gravid, appropriate for gestational age.  Pain/Pressure: Present     Pelvic: Cervical exam deferred        Extremities: Normal range of motion.  Edema: None  Mental Status: Normal mood and affect. Normal behavior. Normal judgment and thought content.   Assessment and Plan:  Pregnancy: G1P0 at 7546w6d  1. Supervision of normal first pregnancy, antepartum Pt has completed the classes for a water birth. She needs to meet with one of the midwives for her next visit for her consent.    2. 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 Please refer to After Visit Summary for other counseling recommendations.  Return in about 2 weeks (around  06/08/2018).  Future Appointments  Date Time Provider Department Center  05/25/2018  9:30 AM Willodean RosenthalHarraway-Smith, Infantof Villagomez, MD [facility] None    Willodean Rosenthalarolyn Harraway-Smith, MD

## 2018-06-01 ENCOUNTER — Encounter: Payer: Medicaid Other | Admitting: Obstetrics & Gynecology

## 2018-06-02 ENCOUNTER — Telehealth: Payer: Self-pay

## 2018-06-02 ENCOUNTER — Encounter: Payer: Self-pay | Admitting: Advanced Practice Midwife

## 2018-06-02 ENCOUNTER — Encounter (HOSPITAL_COMMUNITY): Payer: Self-pay

## 2018-06-02 ENCOUNTER — Inpatient Hospital Stay (HOSPITAL_COMMUNITY)
Admission: AD | Admit: 2018-06-02 | Discharge: 2018-06-02 | Disposition: A | Discharge status: Home / Self Care | Payer: Medicaid Other | Source: Ambulatory Visit | Attending: Obstetrics and Gynecology | Admitting: Obstetrics and Gynecology

## 2018-06-02 DIAGNOSIS — O9989 Other specified diseases and conditions complicating pregnancy, childbirth and the puerperium: Secondary | ICD-10-CM | POA: Diagnosis not present

## 2018-06-02 DIAGNOSIS — Z3A36 36 weeks gestation of pregnancy: Secondary | ICD-10-CM | POA: Diagnosis not present

## 2018-06-02 DIAGNOSIS — Z3689 Encounter for other specified antenatal screening: Secondary | ICD-10-CM

## 2018-06-02 DIAGNOSIS — O2343 Unspecified infection of urinary tract in pregnancy, third trimester: Secondary | ICD-10-CM | POA: Diagnosis not present

## 2018-06-02 DIAGNOSIS — Z3400 Encounter for supervision of normal first pregnancy, unspecified trimester: Secondary | ICD-10-CM

## 2018-06-02 DIAGNOSIS — O26893 Other specified pregnancy related conditions, third trimester: Secondary | ICD-10-CM | POA: Diagnosis not present

## 2018-06-02 DIAGNOSIS — R109 Unspecified abdominal pain: Secondary | ICD-10-CM | POA: Insufficient documentation

## 2018-06-02 LAB — URINALYSIS, ROUTINE W REFLEX MICROSCOPIC
BILIRUBIN URINE: NEGATIVE
GLUCOSE, UA: NEGATIVE mg/dL
Ketones, ur: 20 mg/dL — AB
NITRITE: NEGATIVE
PH: 6 (ref 5.0–8.0)
Protein, ur: 30 mg/dL — AB
SPECIFIC GRAVITY, URINE: 1.02 (ref 1.005–1.030)

## 2018-06-02 MED ORDER — CEPHALEXIN 500 MG PO CAPS: 500.0000 mg | ORAL_CAPSULE | Freq: Two times a day (BID) | ORAL | 0 refills | Status: DC

## 2018-06-02 MED ORDER — CEPHALEXIN 500 MG PO CAPS: 500.0000 mg | ORAL_CAPSULE | Freq: Four times a day (QID) | ORAL | 0 refills | Status: DC

## 2018-06-02 NOTE — MAU Provider Note (Addendum)
/ History     CSN: [id number]  Arrival date and time: 06/02/18 4098   First Provider Initiated Contact with Patient 06/02/18 0955      Chief Complaint  Patient presents with  . Flank Pain  . Emesis   HPI  Lisa Levy is a 27 y.o. G1P0 at [redacted]w[redacted]d who presents to MAU for right sided flank pain. She reports that she first noticed the pain sometime Sunday 05/31/18 but it significantly worsened Monday 06/01/18 and she could not sleep overnight last night. Also describes urinary symptoms including "having to pee" , "pressure in my bladder and trying but not being able to go", and urine that is "a little darker than normal".  OB History    Gravida  1   Para      Term      Preterm      AB      Living        SAB      TAB      Ectopic      Multiple      Live Births              Past Medical History:  Diagnosis Date  . Chronic kidney disease   . Head trauma     Past Surgical History:  Procedure Laterality Date  . BRAIN SURGERY      Family History  Problem Relation Age of Onset  . Cancer Paternal Grandmother   . Diabetes Neg Hx   . Hypertension Neg Hx   . Stroke Neg Hx     Social History   Tobacco Use  . Smoking status: Never Smoker  . Smokeless tobacco: Never Used  Substance Use Topics  . Alcohol use: No    Frequency: Never  . Drug use: No    Allergies: No Known Allergies  Medications Prior to Admission  Medication Sig Dispense Refill Last Dose  . acetaminophen (TYLENOL) 500 MG tablet Take 1,000 mg by mouth every 6 (six) hours as needed for moderate pain.   06/01/2018 at Unknown time  . Prenatal Multivit-Min-Fe-FA (PRENATAL VITAMINS) 0.8 MG tablet Take 1 tablet by mouth daily. 30 tablet 12 06/01/2018 at Unknown time    Review of Systems  Constitutional: Positive for activity change and fatigue. Negative for appetite change, chills, fever and unexpected weight change.  Respiratory: Negative for shortness of breath and wheezing.   Gastrointestinal:  Negative for nausea and vomiting.  Genitourinary: Positive for decreased urine volume, difficulty urinating, dysuria, flank pain, frequency and urgency. Negative for hematuria, vaginal bleeding, vaginal discharge and vaginal pain.  Neurological: Negative for headaches.   Physical Exam   Blood pressure 119/81, pulse (!) 116, temperature 98.4 F (36.9 C), temperature source Oral, resp. rate 18, height 5\' 4"  (1.626 m), weight 177 lb 1.9 oz (80.3 kg), last menstrual period 10/17/2017.  Physical Exam  Nursing note and vitals reviewed. Constitutional: She is oriented to person, place, and time. She appears well-developed and well-nourished.  Cardiovascular: Normal rate and regular rhythm.  Respiratory: Effort normal and breath sounds normal.  GI: There is no tenderness. There is no CVA tenderness.  Gravid  Neurological: She is alert and oriented to person, place, and time. She has normal reflexes.  Skin: Skin is warm and dry.  Psychiatric: She has a normal mood and affect. Her behavior is normal. Judgment and thought content normal.    MAU Course  Procedures  MDM Orders Placed This Encounter  Procedures  . Culture, OB  Urine    Standing Status:   Standing    Number of Occurrences:   1  . Urinalysis, Routine w reflex microscopic    Standing Status:   Standing    Number of Occurrences:   1  . Discharge patient    Order Specific Question:   Discharge disposition    Answer:   01-Home or Self Care [1]    Order Specific Question:   Discharge patient date    Answer:   06/02/2018   Results for orders placed or performed during the hospital encounter of 06/02/18 (from the past 24 hour(s))  Urinalysis, Routine w reflex microscopic     Status: Abnormal   Collection Time: 06/02/18  9:11 AM  Result Value Ref Range   Color, Urine YELLOW YELLOW   APPearance CLOUDY (A) CLEAR   Specific Gravity, Urine 1.020 1.005 - 1.030   pH 6.0 5.0 - 8.0   Glucose, UA NEGATIVE NEGATIVE mg/dL   Hgb urine  dipstick SMALL (A) NEGATIVE   Bilirubin Urine NEGATIVE NEGATIVE   Ketones, ur 20 (A) NEGATIVE mg/dL   Protein, ur 30 (A) NEGATIVE mg/dL   Nitrite NEGATIVE NEGATIVE   Leukocytes, UA TRACE (A) NEGATIVE   RBC / HPF 21-50 0 - 5 RBC/hpf   WBC, UA 11-20 0 - 5 WBC/hpf   Bacteria, UA RARE (A) NONE SEEN   Squamous Epithelial / LPF 21-50 0 - 5   Mucus PRESENT     Assessment and Plan  -27 y.o. G1P0 at 1754w0d  -Reactive NST: Baseline 140, positive accelerations,no decelerations -Toco: Rare contractions, palpate mild -Keflex 500mg  QID x 7 days sent to pharmacy on record -Urine culture ordered -Discussed that antibiotic treatment may be influenced by results of culture -Return to MAU for worsening symptoms including recurrence of flank pain, fever, painful urination -Labor precautions including but not limited to vaginal bleeding, decreased fetal movement, leaking of fluid, headache not relieved by Tylenol -Discharge home in stable condition  Calvert CantorSamantha C Adithya Difrancesco, CNM 06/02/2018, 11:12 AM

## 2018-06-02 NOTE — MAU Note (Signed)
Pt started vomiting during the night, having pain in R flank, thinks she may have kidney infection.  Burning with urination since yesterday.  Denies contractions, bleeding or LOF.  Reports good fetal movement.

## 2018-06-02 NOTE — MAU Note (Signed)
Pt unable to urinate, will try again shortly

## 2018-06-02 NOTE — Telephone Encounter (Signed)
Patient states that she was up all night with back pain on her right side. Patient states she thinks she has a kidney infection due to the pain she is experiencing.  patient states baby is moving well. Denies any bleeding/leaking of fluid.  Advised patient to go to MAU for care due to there is not a provider in office today. Patient states understanding. Armandina StammerJennifer Hang Ammon RN

## 2018-06-02 NOTE — Discharge Instructions (Signed)

## 2018-06-03 LAB — CULTURE, OB URINE

## 2018-06-09 ENCOUNTER — Ambulatory Visit (INDEPENDENT_AMBULATORY_CARE_PROVIDER_SITE_OTHER): Payer: Medicaid Other | Admitting: Advanced Practice Midwife

## 2018-06-09 ENCOUNTER — Other Ambulatory Visit (HOSPITAL_COMMUNITY)
Admission: RE | Admit: 2018-06-09 | Discharge: 2018-06-09 | Disposition: A | Discharge status: Home / Self Care | Payer: Medicaid Other | Source: Ambulatory Visit | Attending: Advanced Practice Midwife | Admitting: Advanced Practice Midwife

## 2018-06-09 VITALS — BP 127/80 | HR 102 | Wt 183.0 lb

## 2018-06-09 DIAGNOSIS — Z3A00 Weeks of gestation of pregnancy not specified: Secondary | ICD-10-CM | POA: Insufficient documentation

## 2018-06-09 DIAGNOSIS — Z3490 Encounter for supervision of normal pregnancy, unspecified, unspecified trimester: Secondary | ICD-10-CM | POA: Diagnosis present

## 2018-06-09 DIAGNOSIS — Z3493 Encounter for supervision of normal pregnancy, unspecified, third trimester: Secondary | ICD-10-CM

## 2018-06-09 DIAGNOSIS — Z3400 Encounter for supervision of normal first pregnancy, unspecified trimester: Secondary | ICD-10-CM

## 2018-06-10 ENCOUNTER — Encounter: Payer: Self-pay | Admitting: Advanced Practice Midwife

## 2018-06-10 NOTE — Patient Instructions (Signed)

## 2018-06-10 NOTE — Progress Notes (Signed)
   PRENATAL VISIT NOTE  Subjective:  Lisa Levy is a 27 y.o. G1P0 at 5754w1d being seen today for ongoing prenatal care.  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  Patient reports occasional contractions.  Contractions: Not present. Vag. Bleeding: None.  Movement: Present. Denies leaking of fluid.   The following portions of the patient's history were reviewed and updated as appropriate: allergies, current medications, past family history, past medical history, past social history, past surgical history and problem list. Problem list updated.  Objective:   Vitals:   06/09/18 0900  BP: 127/80  Pulse: (!) 102  Weight: 183 lb (83 kg)    Fetal Status: Fetal Heart Rate (bpm): 145   Movement: Present     General:  Alert, oriented and cooperative. Patient is in no acute distress.  Skin: Skin is warm and dry. No rash noted.   Cardiovascular: Normal heart rate noted  Respiratory: Normal respiratory effort, no problems with respiration noted  Abdomen: Soft, gravid, appropriate for gestational age.  Pain/Pressure: Present     Pelvic: Cervical exam performed       Cx 1/70%/-3/vtx  Extremities: Normal range of motion.  Edema: Trace  Mental Status: Normal mood and affect. Normal behavior. Normal judgment and thought content.   Assessment and Plan:  Pregnancy: G1P0 at 454w1d  1. Prenatal care, antepartum    Discussed signs of labor    Discussed waterbirth and coverage. Has own tub, husb and mother will set up.  Has list of supplies needs to add thermometer - Culture, beta strep (group b only) - GC/Chlamydia probe amp ([place]  2. Supervision of normal first pregnancy, antepartum     Labor precautions     GBS collected Term labor symptoms and general obstetric precautions including but not limited to vaginal bleeding, contractions,  leaking of fluid and fetal movement were reviewed in detail with the patient. Please refer to After Visit Summary for other counseling recommendations.  No follow-ups on file.  Future Appointments  Date Time Provider Department Center  06/15/2018 11:00 AM Levie HeritageStinson, Jacob J, DO [facility] None    Wynelle BourgeoisMarie Donnice Nielsen, CNM

## 2018-06-10 NOTE — Progress Notes (Deleted)
   PRENATAL VISIT NOTE  Subjective:  Lisa Levy is a 27 y.o. G1P0 at 8749w1d being seen today for ongoing prenatal care.  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  Patient reports {sx:14538}.  Contractions: Not present. Vag. Bleeding: None.  Movement: Present. Denies leaking of fluid.   The following portions of the patient's history were reviewed and updated as appropriate: allergies, current medications, past family history, past medical history, past social history, past surgical history and problem list. Problem list updated.  Objective:   Vitals:   06/09/18 0900  BP: 127/80  Pulse: (!) 102  Weight: 183 lb (83 kg)    Fetal Status: Fetal Heart Rate (bpm): 145   Movement: Present     General:  Alert, oriented and cooperative. Patient is in no acute distress.  Skin: Skin is warm and dry. No rash noted.   Cardiovascular: Normal heart rate noted  Respiratory: Normal respiratory effort, no problems with respiration noted  Abdomen: Soft, gravid, appropriate for gestational age.  Pain/Pressure: Present     Pelvic: {Blank single:19197::"Cervical exam performed","Cervical exam deferred"}        Extremities: Normal range of motion.  Edema: Trace  Mental Status: Normal mood and affect. Normal behavior. Normal judgment and thought content.   Assessment and Plan:  Pregnancy: G1P0 at 6149w1d  1. Prenatal care, antepartum *** - Culture, beta strep (group b only) - GC[facility]Chlamydia probe amp ()[facility]  2. Supervision of normal first pregnancy, antepartum ***  {Blank single:19197::"Term","Preterm"} labor symptoms and general obstetric precautions including but not limited to vaginal bleeding, contractions, leaking of fluid and fetal movement were reviewed in detail with the patient. Please refer to After  Visit Summary for other counseling recommendations.  No follow-ups on file.  Future Appointments  Date Time Provider Department Center  06/15/2018 11:00 AM Levie HeritageStinson, Jacob J, DO [facility] None    Wynelle BourgeoisMarie Fares Ramthun, CNM

## 2018-06-11 LAB — GC/CHLAMYDIA PROBE AMP (~~LOC~~) NOT AT ARMC
Chlamydia: NEGATIVE
NEISSERIA GONORRHEA: NEGATIVE

## 2018-06-13 LAB — CULTURE, BETA STREP (GROUP B ONLY): STREP GP B CULTURE: POSITIVE — AB

## 2018-06-15 ENCOUNTER — Ambulatory Visit (INDEPENDENT_AMBULATORY_CARE_PROVIDER_SITE_OTHER): Payer: Medicaid Other | Admitting: Family Medicine

## 2018-06-15 VITALS — BP 116/71 | HR 98 | Wt 183.4 lb

## 2018-06-15 DIAGNOSIS — Z3400 Encounter for supervision of normal first pregnancy, unspecified trimester: Secondary | ICD-10-CM

## 2018-06-15 DIAGNOSIS — Z3403 Encounter for supervision of normal first pregnancy, third trimester: Secondary | ICD-10-CM

## 2018-06-15 NOTE — Progress Notes (Signed)
   PRENATAL VISIT NOTE  Subjective:  Lisa Levy is a 27 y.o. G1P0 at 3114w6d being seen today for ongoing prenatal care.  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  Patient reports no complaints.  Contractions: Not present. Vag. Bleeding: None.  Movement: Present. Denies leaking of fluid.   The following portions of the patient's history were reviewed and updated as appropriate: allergies, current medications, past family history, past medical history, past social history, past surgical history and problem list. Problem list updated.  Objective:   Vitals:   06/15/18 1103  BP: 116/71  Pulse: 98  Weight: 183 lb 6.4 oz (83.2 kg)    Fetal Status: Fetal Heart Rate (bpm): 143 Fundal Height: 38 cm Movement: Present     General:  Alert, oriented and cooperative. Patient is in no acute distress.  Skin: Skin is warm and dry. No rash noted.   Cardiovascular: Normal heart rate noted  Respiratory: Normal respiratory effort, no problems with respiration noted  Abdomen: Soft, gravid, appropriate for gestational age.  Pain/Pressure: Present     Pelvic: Cervical exam deferred        Extremities: Normal range of motion.  Edema: Trace  Mental Status: Normal mood and affect. Normal behavior. Normal judgment and thought content.   Assessment and Plan:  Pregnancy: G1P0 at 2914w6d  1. Supervision of normal first pregnancy, antepartum FHT and FH normal  Preterm labor symptoms and general obstetric precautions including but not limited to vaginal bleeding, contractions, leaking of fluid and fetal movement were reviewed in detail with the patient. Please refer to After Visit Summary for other counseling recommendations.  Return in about 1 week (around 06/22/2018).  No future appointments.  Levie HeritageJacob J Ryen Rhames, DO

## 2018-06-22 ENCOUNTER — Encounter: Payer: Self-pay | Admitting: Obstetrics & Gynecology

## 2018-06-22 ENCOUNTER — Ambulatory Visit (INDEPENDENT_AMBULATORY_CARE_PROVIDER_SITE_OTHER): Payer: Medicaid Other | Admitting: Obstetrics & Gynecology

## 2018-06-22 VITALS — BP 109/69 | HR 92 | Wt 183.4 lb

## 2018-06-22 DIAGNOSIS — Z967 Presence of other bone and tendon implants: Secondary | ICD-10-CM

## 2018-06-22 DIAGNOSIS — Z3400 Encounter for supervision of normal first pregnancy, unspecified trimester: Secondary | ICD-10-CM

## 2018-06-22 NOTE — Progress Notes (Signed)
   PRENATAL VISIT NOTE  Subjective:  Lisa Levy is a 27 y.o. G1P0 at 3033w6d being seen today for ongoing prenatal care.  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  Patient reports no complaints.  Contractions: Not present. Vag. Bleeding: None.  Movement: Present. Denies leaking of fluid.   The following portions of the patient's history were reviewed and updated as appropriate: allergies, current medications, past family history, past medical history, past social history, past surgical history and problem list. Problem list updated.  Objective:   Vitals:   06/22/18 0833  BP: 109/69  Pulse: 92  Weight: 183 lb 6.4 oz (83.2 kg)    Fetal Status: Fetal Heart Rate (bpm): 152   Movement: Present     General:  Alert, oriented and cooperative. Patient is in no acute distress.  Skin: Skin is warm and dry. No rash noted.   Cardiovascular: Normal heart rate noted  Respiratory: Normal respiratory effort, no problems with respiration noted  Abdomen: Soft, gravid, appropriate for gestational age.  Pain/Pressure: Present     Pelvic: Cervical exam deferred        Extremities: Normal range of motion.  Edema: Trace  Mental Status: Normal mood and affect. Normal behavior. Normal judgment and thought content.   Assessment and Plan:  Pregnancy: G1P0 at 1333w6d  1. Supervision of normal first pregnancy, antepartum Desires water birth  2. 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 Please refer to After Visit Summary for other counseling recommendations.  Return in about 1 week (around 06/29/2018).  No future appointments.  Willodean Rosenthalarolyn Harraway-Smith, MD

## 2018-06-29 ENCOUNTER — Inpatient Hospital Stay (HOSPITAL_COMMUNITY)
Admission: AD | Admit: 2018-06-29 | Discharge: 2018-07-01 | DRG: 807 | Disposition: A | Discharge status: Home / Self Care | Payer: Medicaid Other | Attending: Obstetrics and Gynecology | Admitting: Obstetrics and Gynecology

## 2018-06-29 ENCOUNTER — Other Ambulatory Visit: Payer: Self-pay

## 2018-06-29 ENCOUNTER — Encounter (HOSPITAL_COMMUNITY): Payer: Self-pay | Admitting: *Deleted

## 2018-06-29 DIAGNOSIS — Z3400 Encounter for supervision of normal first pregnancy, unspecified trimester: Secondary | ICD-10-CM

## 2018-06-29 DIAGNOSIS — O99824 Streptococcus B carrier state complicating childbirth: Secondary | ICD-10-CM | POA: Diagnosis present

## 2018-06-29 DIAGNOSIS — Z3483 Encounter for supervision of other normal pregnancy, third trimester: Secondary | ICD-10-CM | POA: Diagnosis present

## 2018-06-29 DIAGNOSIS — Z967 Presence of other bone and tendon implants: Secondary | ICD-10-CM

## 2018-06-29 DIAGNOSIS — Z3A39 39 weeks gestation of pregnancy: Secondary | ICD-10-CM

## 2018-06-29 DIAGNOSIS — O479 False labor, unspecified: Secondary | ICD-10-CM | POA: Diagnosis present

## 2018-06-29 LAB — CBC
HCT: 39 % (ref 36.0–46.0)
HEMOGLOBIN: 13.3 g/dL (ref 12.0–15.0)
MCH: 32 pg (ref 26.0–34.0)
MCHC: 34.1 g/dL (ref 30.0–36.0)
MCV: 94 fL (ref 78.0–100.0)
PLATELETS: 190 10*3/uL (ref 150–400)
RBC: 4.15 MIL/uL (ref 3.87–5.11)
RDW: 12.9 % (ref 11.5–15.5)
WBC: 13.4 10*3/uL — ABNORMAL HIGH (ref 4.0–10.5)

## 2018-06-29 LAB — ABO/RH: ABO/RH(D): A POS

## 2018-06-29 LAB — TYPE AND SCREEN
ABO/RH(D): A POS
ANTIBODY SCREEN: NEGATIVE

## 2018-06-29 MED ORDER — ZOLPIDEM TARTRATE 5 MG PO TABS: 5.0000 mg | ORAL_TABLET | Freq: Every evening | ORAL | Status: DC | PRN

## 2018-06-29 MED ORDER — COCONUT OIL OIL
1.0000 "application " | TOPICAL_OIL | Status: DC | PRN
  Administered 2018-07-01: 1 via TOPICAL
  Filled 2018-06-29: qty 120

## 2018-06-29 MED ORDER — OXYTOCIN BOLUS FROM INFUSION
500.0000 mL | Freq: Once | INTRAVENOUS | Status: AC
  Administered 2018-06-29: 500 mL via INTRAVENOUS

## 2018-06-29 MED ORDER — ACETAMINOPHEN 325 MG PO TABS: 650.0000 mg | ORAL_TABLET | ORAL | Status: DC | PRN

## 2018-06-29 MED ORDER — SODIUM CHLORIDE 0.9 % IV SOLN: 250.0000 mL | INTRAVENOUS | Status: DC | PRN

## 2018-06-29 MED ORDER — LACTATED RINGERS IV SOLN
500.0000 mL | INTRAVENOUS | Status: DC | PRN
  Administered 2018-06-29: 1000 mL via INTRAVENOUS

## 2018-06-29 MED ORDER — ACETAMINOPHEN 325 MG PO TABS
650.0000 mg | ORAL_TABLET | ORAL | Status: DC | PRN
  Filled 2018-06-29: qty 2

## 2018-06-29 MED ORDER — PRENATAL MULTIVITAMIN CH
1.0000 | ORAL_TABLET | Freq: Every day | ORAL | Status: DC
  Administered 2018-06-30 – 2018-07-01 (×2): 1 via ORAL
  Filled 2018-06-29 (×2): qty 1

## 2018-06-29 MED ORDER — DIBUCAINE 1 % RE OINT: 1.0000 "application " | TOPICAL_OINTMENT | RECTAL | Status: DC | PRN

## 2018-06-29 MED ORDER — SOD CITRATE-CITRIC ACID 500-334 MG/5ML PO SOLN: 30.0000 mL | ORAL | Status: DC | PRN

## 2018-06-29 MED ORDER — SIMETHICONE 80 MG PO CHEW: 80.0000 mg | CHEWABLE_TABLET | ORAL | Status: DC | PRN

## 2018-06-29 MED ORDER — SODIUM CHLORIDE 0.9% FLUSH: 3.0000 mL | INTRAVENOUS | Status: DC | PRN

## 2018-06-29 MED ORDER — TETANUS-DIPHTH-ACELL PERTUSSIS 5-2.5-18.5 LF-MCG/0.5 IM SUSP: 0.5000 mL | Freq: Once | INTRAMUSCULAR | Status: DC

## 2018-06-29 MED ORDER — IBUPROFEN 600 MG PO TABS
600.0000 mg | ORAL_TABLET | Freq: Four times a day (QID) | ORAL | Status: DC
  Administered 2018-06-29 – 2018-07-01 (×7): 600 mg via ORAL
  Filled 2018-06-29 (×7): qty 1

## 2018-06-29 MED ORDER — OXYCODONE-ACETAMINOPHEN 5-325 MG PO TABS: 2.0000 | ORAL_TABLET | ORAL | Status: DC | PRN

## 2018-06-29 MED ORDER — ONDANSETRON HCL 4 MG/2ML IJ SOLN: 4.0000 mg | INTRAMUSCULAR | Status: DC | PRN

## 2018-06-29 MED ORDER — WITCH HAZEL-GLYCERIN EX PADS: 1.0000 "application " | MEDICATED_PAD | CUTANEOUS | Status: DC | PRN

## 2018-06-29 MED ORDER — SENNOSIDES-DOCUSATE SODIUM 8.6-50 MG PO TABS
2.0000 | ORAL_TABLET | ORAL | Status: DC
  Administered 2018-06-29 – 2018-07-01 (×2): 2 via ORAL
  Filled 2018-06-29 (×2): qty 2

## 2018-06-29 MED ORDER — DIPHENHYDRAMINE HCL 25 MG PO CAPS: 25.0000 mg | ORAL_CAPSULE | Freq: Four times a day (QID) | ORAL | Status: DC | PRN

## 2018-06-29 MED ORDER — SODIUM CHLORIDE 0.9% FLUSH: 3.0000 mL | Freq: Two times a day (BID) | INTRAVENOUS | Status: DC

## 2018-06-29 MED ORDER — LIDOCAINE HCL (PF) 1 % IJ SOLN
30.0000 mL | INTRAMUSCULAR | Status: DC | PRN
  Administered 2018-06-29: 30 mL via SUBCUTANEOUS
  Filled 2018-06-29: qty 30

## 2018-06-29 MED ORDER — OXYCODONE-ACETAMINOPHEN 5-325 MG PO TABS: 1.0000 | ORAL_TABLET | ORAL | Status: DC | PRN

## 2018-06-29 MED ORDER — ONDANSETRON HCL 4 MG PO TABS: 4.0000 mg | ORAL_TABLET | ORAL | Status: DC | PRN

## 2018-06-29 MED ORDER — SODIUM CHLORIDE 0.9 % IV SOLN
2.0000 g | Freq: Four times a day (QID) | INTRAVENOUS | Status: DC
  Filled 2018-06-29 (×2): qty 2000

## 2018-06-29 MED ORDER — OXYTOCIN 40 UNITS IN LACTATED RINGERS INFUSION - SIMPLE MED
2.5000 [IU]/h | INTRAVENOUS | Status: DC
  Filled 2018-06-29: qty 1000

## 2018-06-29 MED ORDER — FENTANYL CITRATE (PF) 100 MCG/2ML IJ SOLN: 100.0000 ug | INTRAMUSCULAR | Status: DC | PRN

## 2018-06-29 MED ORDER — BENZOCAINE-MENTHOL 20-0.5 % EX AERO: 1.0000 "application " | INHALATION_SPRAY | CUTANEOUS | Status: DC | PRN

## 2018-06-29 MED ORDER — SODIUM CHLORIDE 0.9 % IV SOLN
2.0000 g | Freq: Once | INTRAVENOUS | Status: AC
  Administered 2018-06-29: 2 g via INTRAVENOUS
  Filled 2018-06-29: qty 2

## 2018-06-29 MED ORDER — ONDANSETRON HCL 4 MG/2ML IJ SOLN: 4.0000 mg | Freq: Four times a day (QID) | INTRAMUSCULAR | Status: DC | PRN

## 2018-06-29 NOTE — MAU Note (Signed)
Pt presents with c/o ctxs since 0630 tjis morning.  Denies LOF or VB.  Reports decreased FM, states little movement today.

## 2018-06-29 NOTE — H&P (Signed)
Lisa Levy is a 27 y.o. female presenting for uterine contractions since this am.  Denies leaking or bleeding..  RN Note: Pt presents with c/o ctxs since 0630 tjis morning.  Denies LOF or VB.  Reports decreased FM, states little movement today.  Planning waterbirth.  Has supplies. Has taken class and signed consent    OB History    Gravida  1   Para      Term      Preterm      AB      Living        SAB      TAB      Ectopic      Multiple      Live Births             Past Medical History:  Diagnosis Date  . Chronic kidney disease   . Head trauma    Past Surgical History:  Procedure Laterality Date  . BRAIN SURGERY     Family History: family history includes Cancer in her paternal grandmother. Social History:  reports that she has never smoked. She has never used smokeless tobacco. She reports that she does not drink alcohol or use drugs.     Maternal Diabetes: No Genetic Screening: Normal Maternal Ultrasounds/Referrals: Normal Fetal Ultrasounds or other Referrals:  None Maternal Substance Abuse:  No Significant Maternal Medications:  None Significant Maternal Lab Results:  Lab values include: Group B Strep positive Other Comments:  Attempting Waterbirth  Review of Systems  Constitutional: Negative for chills, fever and malaise/fatigue.  Eyes: Negative for blurred vision.  Respiratory: Negative for shortness of breath.   Cardiovascular: Negative for leg swelling.  Gastrointestinal: Positive for abdominal pain. Negative for constipation, diarrhea, nausea and vomiting.  Genitourinary: Negative for dysuria.  Musculoskeletal: Negative for myalgias.   Maternal Medical History:  Reason for admission: Contractions.  Nausea.  Contractions: Onset was 6-12 hours ago.   Frequency: regular.   Perceived severity is moderate.    Fetal activity: Perceived fetal activity is decreased.    Prenatal complications: No bleeding, PIH, placental abnormality,  polyhydramnios, pre-eclampsia or preterm labor.   Prenatal Complications - Diabetes: none.    Dilation: 4.5 Effacement (%): 90 Station: -2 Exam by:: F. Morris, RNC Blood pressure 126/81, pulse (!) 109, temperature 98.4 F (36.9 C), temperature source Oral, resp. rate 20, last menstrual period 10/17/2017, SpO2 100 %. Maternal Exam:  Uterine Assessment: Contraction strength is moderate.  Contraction frequency is irregular.   Abdomen: Patient reports no abdominal tenderness. Estimated fetal weight is 6+12.   Fetal presentation: vertex  Introitus: Normal vulva. Normal vagina.  Ferning test: not done.  Nitrazine test: not done. Amniotic fluid character: not assessed.  Cervix: Cervix evaluated by digital exam.     Fetal Exam Fetal Monitor Review: Mode: ultrasound.   Baseline rate: 140.  Variability: moderate (6-25 bpm).   Pattern: accelerations present and no decelerations.    Fetal State Assessment: Category I - tracings are normal.     Physical Exam  Constitutional: She is oriented to person, place, and time. She appears well-developed and well-nourished. No distress.  HENT:  Head: Normocephalic.  Cardiovascular: Normal rate, regular rhythm and normal heart sounds.  Respiratory: Effort normal and breath sounds normal. No respiratory distress. She has no wheezes. She has no rales.  GI: Soft. She exhibits no distension. There is no tenderness. There is no rebound and no guarding.  Genitourinary: Vagina normal.  Genitourinary Comments: Dilation: 5 Effacement (%): 80  Station: -1 Presentation: Vertex Exam by:: Mayford Knife CNM   Musculoskeletal: Normal range of motion.  Neurological: She is alert and oriented to person, place, and time.  Skin: Skin is warm and dry.    Prenatal labs: ABO, Rh: A/Positive/-- (01/22 1025) Antibody: Negative (01/22 1025) Rubella: 1.39 (01/22 1025) RPR: Non Reactive (04/22 0850)  HBsAg: Negative (01/22 1025)  HIV: Non Reactive (04/22 0850)   GBS:     Assessment/Plan: Single intrauterine pregnancy at [redacted]w[redacted]d Latent phase of labor Group B strep Positive  Admit to Birthing Suites  Routine orders Intermittent monitoring Approved as candidate for waterbirth for now Anticipate SVD   Wynelle Bourgeois 06/29/2018, 1:09 PM

## 2018-06-29 NOTE — Anesthesia Pain Management Evaluation Note (Signed)
  CRNA Pain Management Visit Note  Patient: Lisa BarreErin Levy, 27 y.o., female  "Hello I am a member of the anesthesia team at [facility]. We have an anesthesia team available at all times to provide care throughout the hospital, including epidural management and anesthesia for C-section. I don't know your plan for the delivery whether it a natural birth, water birth, IV sedation, nitrous supplementation, doula or epidural, but we want to meet your pain goals."   1.Was your pain managed to your expectations on prior hospitalizations?   No prior hospitalizations  2.What is your expectation for pain management during this hospitalization?     Water tub  3.How can we help you reach that goal? Standby for emergency.  Record the patient's initial score and the patient's pain goal.   Pain: 5  Pain Goal: 10 [facility] wants you to be able to say your pain was always managed very well.  Sintia Mckissic 06/29/2018

## 2018-06-29 NOTE — Progress Notes (Signed)
Patient ID: Lisa Levy, female   DOB: 10/13/1991, 27 y.o.   MRN: [id number] Feeling pressure  Still in tub  FHR 140s-150s  Coping very well with family support  Dilation: Lip/rim Effacement (%): 80 Station: -1 Presentation: Vertex Exam by:: Jabil CircuitWilliams CNM

## 2018-06-29 NOTE — Progress Notes (Signed)
Pt informed she may ambulate.  Instructed to remain on the 1st floor and return by 1235 for re eval or sooner if ROM or VB.  Pt verbalized understanding and agreeable.

## 2018-06-30 ENCOUNTER — Encounter: Payer: Medicaid Other | Admitting: Advanced Practice Midwife

## 2018-06-30 LAB — RPR: RPR Ser Ql: NONREACTIVE

## 2018-06-30 NOTE — Progress Notes (Signed)
Patient ID: Lisa Levy, female   DOB: May 15, 1991, 27 y.o.   MRN: [id number]   POSTPARTUM PROGRESS NOTE  Post Partum Day 1 Subjective:  Lisa Levy is a 27 y.o. G1P1001 8644w6d s/p SVD by waterbirth at 1856 after spontaneous onset of labor.  No acute events overnight.  Pt denies problems with ambulating, voiding or po intake.  She denies nausea or vomiting.  Pain is well controlled. Lochia Minimal.   Objective: Blood pressure 110/75, pulse 88, temperature 98.1 F (36.7 C), temperature source Oral, resp. rate 18, last menstrual period 10/17/2017, SpO2 100 %, unknown if currently breastfeeding.  Physical Exam:  General: alert, cooperative and no distress Lochia:normal flow Chest: no respiratory distress Heart:regular rate, distal pulses intact Abdomen: soft, nontender,  Uterine Fundus: firm, appropriately tender DVT Evaluation: No calf swelling or tenderness Extremities: no edema  Vitals:   06/29/18 2122 06/29/18 2241 06/30/18 0249 06/30/18 0728  BP: 119/82 118/78 112/76 110/75  Pulse: (!) 115 (!) 112 (!) 106 88  Resp: 18 18 18 18   Temp: 98.4 F (36.9 C) 98.3 F (36.8 C) 98.4 F (36.9 C) 98.1 F (36.7 C)  TempSrc: Oral Oral Oral Oral  SpO2:        Recent Labs    06/29/18 1337  HGB 13.3  HCT 39.0   Assessment/Plan:  ASSESSMENT: Lisa Levy is a 27 y.o. G1P1001 3944w6d s/p SVD by waterbirth  Plan for discharge tomorrow  Breastfeeding Contraception: OCPs   LOS: 1 day   Candie MileLindsey R Antuan Limes 06/30/2018, 9:12 AM

## 2018-07-01 MED ORDER — IBUPROFEN 600 MG PO TABS: 600.0000 mg | ORAL_TABLET | Freq: Four times a day (QID) | ORAL | 0 refills | Status: DC

## 2018-07-01 NOTE — Lactation Note (Signed)
This note was copied from a baby's chart. Lactation Consultation Note  Patient Name: Girl Oliver Barrerin Morrow UEAVW'UToday's Date: 07/01/2018 Reason for consult: Initial assessment;Primapara;Term Breastfeeding consultation services and support information given.  Mom states baby is feeding well on left but cannot sustain a latch on right.  Assisted with waking techniques and placed baby skin to skin in football hold.  After a few attempts baby latched with good depth and fed actively.  Observed baby for 10 minutes and baby still feeding well when I left.  Mom and baby to be discharged today.  20 mm nipple shield given to patient with instructions in case she has a difficult latch on right.  Mom has a breast pump at home.  Discussed milk coming to volume and engorgement prevention and treatment.  Questions answered.  Maternal Data Has patient been taught Hand Expression?: Yes Does the patient have breastfeeding experience prior to this delivery?: No  Feeding Feeding Type: Breast Fed  LATCH Score Latch: Grasps breast easily, tongue down, lips flanged, rhythmical sucking.  Audible Swallowing: A few with stimulation  Type of Nipple: Everted at rest and after stimulation  Comfort (Breast/Nipple): Soft / non-tender  Hold (Positioning): Assistance needed to correctly position infant at breast and maintain latch.  LATCH Score: 8  Interventions Interventions: Assisted with latch;Breast compression;Skin to skin;Adjust position;Breast massage;Support pillows;Hand express;Position options  Lactation Tools Discussed/Used     Consult Status      Huston FoleyMOULDEN, Latravion Graves S 07/01/2018, 9:42 AM

## 2018-07-01 NOTE — Discharge Summary (Addendum)
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 Treated with ampicillin.      Discharge diagnosis: Term Pregnancy Delivered                                                                                              Post partum procedures:none  Augmentation: none  Complications: None  Hospital course:  Onset of Labor With Vaginal Delivery     27 y.o. yo G1P1001 at [redacted]w[redacted]d was admitted in Latent Labor on 06/29/2018. Patient had an uncomplicated labor course and successful waterbirth as follows:  Membrane Rupture Time/Date: 6:53 PM ,06/29/2018   Intrapartum Procedures: Episiotomy: None [1]                                         Lacerations:  2nd degree [3];Periurethral [8]  Patient had a delivery of a Viable infant. 06/29/2018  Information for the patient's newborn:  Lisa, Levy [[id number]]  Delivery Method: Vaginal, Spontaneous(Filed from Delivery Summary)    Pateint had an uncomplicated postpartum course.  She is ambulating, tolerating a regular diet, passing flatus, and urinating well. Patient is discharged home in stable condition on 07/01/18.   Physical exam  Vitals:   06/30/18 0249 06/30/18 0728 06/30/18 1345 06/30/18 2228  BP: 112/76 110/75 116/78 118/82  Pulse: (!) 106 88 100 89  Resp: 18 18 18 18   Temp: 98.4 F (36.9 C) 98.1 F (36.7 C) 98 F (36.7 C) 98.4 F (36.9 C)  TempSrc: Oral Oral Oral Oral  SpO2:    100%   General: alert, cooperative and no distress Lochia: appropriate Uterine Fundus: firm Incision: Healing well with no significant drainage DVT Evaluation: No  evidence of DVT seen on physical exam. Labs: Lab Results  Component Value Date   WBC 13.4 (H) 06/29/2018   HGB 13.3 06/29/2018   HCT 39.0 06/29/2018   MCV 94.0 06/29/2018   PLT 190 06/29/2018   No flowsheet data found.  Discharge instruction: per After Visit Summary and "Baby and Me Booklet".  After visit meds:  Allergies as of 07/01/2018   No Known Allergies     Medication List    TAKE these medications   ibuprofen 600 MG tablet Commonly known as:  ADVIL,MOTRIN Take 1 tablet (600 mg total) by mouth every 6 (six) hours.   Prenatal Vitamins 0.8 MG tablet Take 1 tablet by mouth daily.       Diet: routine diet  Activity: Advance as tolerated. Pelvic rest for 6 weeks.   Outpatient follow up:6 weeks Follow up Appt: Future Appointments  Date Time Provider Department Center  07/31/2018 10:30 AM Willodean Rosenthal, MD  [facility] None   Follow up Visit:No follow-ups on file.  Postpartum contraception: Progesterone only pills  Newborn Data: Live born female  Birth Weight: 8 lb 4.5 oz (3756 g) APGAR: 8, 9  Newborn Delivery   Birth date/time:  06/29/2018 18:56:00 Delivery type:  Vaginal, Spontaneous     Baby Feeding: Breast Disposition:home with mother   07/01/2018 Lisa Kittyaniel K Olson, MD   CNM attestation I have seen and examined this patient and agree with above documentation in the resident's note.   Lisa Levy is a 27 y.o. G1P1001 s/p  SVD.   Pain is well controlled.  Plan for birth control is oral progesterone-only contraceptive.  Method of Feeding: breast  PE:  BP 118/82 (BP Location: Right Arm)   Pulse 89   Temp 98.4 F (36.9 C) (Oral)   Resp 18   LMP 10/17/2017 (Approximate)   SpO2 100%   Breastfeeding? Unknown  Fundus firm  No results for input(s): HGB, HCT in the last 72 hours.   Plan: discharge today - postpartum care discussed - f/u clinic in 6 weeks for postpartum visit   Lisa, Levy, CNM 9:03 PM

## 2018-07-01 NOTE — Discharge Instructions (Signed)
Vaginal Delivery, Care After °Refer to this sheet in the next few weeks. These instructions provide you with information about caring for yourself after vaginal delivery. Your health care provider may also give you more specific instructions. Your treatment has been planned according to current medical practices, but problems sometimes occur. Call your health care provider if you have any problems or questions. °What can I expect after the procedure? °After vaginal delivery, it is common to have: °· Some bleeding from your vagina. °· Soreness in your abdomen, your vagina, and the area of skin between your vaginal opening and your anus (perineum). °· Pelvic cramps. °· Fatigue. ° °Follow these instructions at home: °Medicines °· Take over-the-counter and prescription medicines only as told by your health care provider. °· If you were prescribed an antibiotic medicine, take it as told by your health care provider. Do not stop taking the antibiotic until it is finished. °Driving ° °· Do not drive or operate heavy machinery while taking prescription pain medicine. °· Do not drive for 24 hours if you received a sedative. °Lifestyle °· Do not drink alcohol. This is especially important if you are breastfeeding or taking medicine to relieve pain. °· Do not use tobacco products, including cigarettes, chewing tobacco, or e-cigarettes. If you need help quitting, ask your health care provider. °Eating and drinking °· Drink at least 8 eight-ounce glasses of water every day unless you are told not to by your health care provider. If you choose to breastfeed your baby, you may need to drink more water than this. °· Eat high-fiber foods every day. These foods may help prevent or relieve constipation. High-fiber foods include: °? Whole grain cereals and breads. °? Brown rice. °? Beans. °? Fresh fruits and vegetables. °Activity °· Return to your normal activities as told by your health care provider. Ask your health care provider  what activities are safe for you. °· Rest as much as possible. Try to rest or take a nap when your baby is sleeping. °· Do not lift anything that is heavier than your baby or 10 lb (4.5 kg) until your health care provider says that it is safe. °· Talk with your health care provider about when you can engage in sexual activity. This may depend on your: °? Risk of infection. °? Rate of healing. °? Comfort and desire to engage in sexual activity. °Vaginal Care °· If you have an episiotomy or a vaginal tear, check the area every day for signs of infection. Check for: °? More redness, swelling, or pain. °? More fluid or blood. °? Warmth. °? Pus or a bad smell. °· Do not use tampons or douches until your health care provider says this is safe. °· Watch for any blood clots that may pass from your vagina. These may look like clumps of dark red, brown, or black discharge. °General instructions °· Keep your perineum clean and dry as told by your health care provider. °· Wear loose, comfortable clothing. °· Wipe from front to back when you use the toilet. °· Ask your health care provider if you can shower or take a bath. If you had an episiotomy or a perineal tear during labor and delivery, your health care provider may tell you not to take baths for a certain length of time. °· Wear a bra that supports your breasts and fits you well. °· If possible, have someone help you with household activities and help care for your baby for at least a few days after   you leave the hospital. °· Keep all follow-up visits for you and your baby as told by your health care provider. This is important. °Contact a health care provider if: °· You have: °? Vaginal discharge that has a bad smell. °? Difficulty urinating. °? Pain when urinating. °? A sudden increase or decrease in the frequency of your bowel movements. °? More redness, swelling, or pain around your episiotomy or vaginal tear. °? More fluid or blood coming from your episiotomy or  vaginal tear. °? Pus or a bad smell coming from your episiotomy or vaginal tear. °? A fever. °? A rash. °? Little or no interest in activities you used to enjoy. °? Questions about caring for yourself or your baby. °· Your episiotomy or vaginal tear feels warm to the touch. °· Your episiotomy or vaginal tear is separating or does not appear to be healing. °· Your breasts are painful, hard, or turn red. °· You feel unusually sad or worried. °· You feel nauseous or you vomit. °· You pass large blood clots from your vagina. If you pass a blood clot from your vagina, save it to show to your health care provider. Do not flush blood clots down the toilet without having your health care provider look at them. °· You urinate more than usual. °· You are dizzy or light-headed. °· You have not breastfed at all and you have not had a menstrual period for 12 weeks after delivery. °· You have stopped breastfeeding and you have not had a menstrual period for 12 weeks after you stopped breastfeeding. °Get help right away if: °· You have: °? Pain that does not go away or does not get better with medicine. °? Chest pain. °? Difficulty breathing. °? Blurred vision or spots in your vision. °? Thoughts about hurting yourself or your baby. °· You develop pain in your abdomen or in one of your legs. °· You develop a severe headache. °· You faint. °· You bleed from your vagina so much that you fill two sanitary pads in one hour. °This information is not intended to replace advice given to you by your health care provider. Make sure you discuss any questions you have with your health care provider. °Document Released: 12/06/2000 Document Revised: 05/22/2016 Document Reviewed: 12/24/2015 °Elsevier Interactive Patient Education © 2018 [employer] ° °

## 2018-07-31 ENCOUNTER — Ambulatory Visit (INDEPENDENT_AMBULATORY_CARE_PROVIDER_SITE_OTHER): Payer: Medicaid Other | Admitting: Obstetrics & Gynecology

## 2018-07-31 DIAGNOSIS — Z3009 Encounter for other general counseling and advice on contraception: Secondary | ICD-10-CM | POA: Diagnosis not present

## 2018-07-31 DIAGNOSIS — L723 Sebaceous cyst: Secondary | ICD-10-CM | POA: Diagnosis not present

## 2018-07-31 DIAGNOSIS — Z1389 Encounter for screening for other disorder: Secondary | ICD-10-CM

## 2018-07-31 MED ORDER — NORETHIN ACE-ETH ESTRAD-FE 1-20 MG-MCG(24) PO TABS: 1.0000 | ORAL_TABLET | Freq: Every day | ORAL | 11 refills | Status: DC

## 2018-07-31 NOTE — Progress Notes (Signed)
Post Partum Exam  Lisa Levy is a 27 y.o. 361P1001 female who presents for a postpartum visit. She is 4.5 weeks postpartum following a spontaneous vaginal delivery. I have fully reviewed the prenatal and intrapartum course. The delivery was at 39.6 gestational weeks.  Anesthesia: none. Water birth. Postpartum course has been uneventful. Baby's course has been uneventful. Baby is feeding by bottle- Enfamil. Bleeding no bleeding. Bowel function is normal. Bladder function is normal. Patient is not sexually active. Contraception method is oral contraception. Postpartum depression screening:neg (score 6)  Pt reports cyst on mons. She reports that she had the before but, it went away. Pt does shave.    Last pap smear done 01-13-18 and was normal.  Review of Systems Pertinent items are noted in HPI.    Objective:  Last menstrual period 10/17/2017, unknown if currently breastfeeding.  BP 117/72 (BP Location: Right Arm)   Pulse 89   Ht 5\' 4"  (1.626 m)   Wt 160 lb (72.6 kg)   LMP 10/17/2017 (Approximate)   BMI 27.46 kg/m   CONSTITUTIONAL: Well-developed, well-nourished female in no acute distress.  HENT:  Normocephalic, atraumatic EYES: Conjunctivae and EOM are normal. No scleral icterus.  NECK: Normal range of motion SKIN: Skin is warm and dry. No rash noted. Not diaphoretic.No pallor. NEUROLGIC: Alert and oriented to person, place, and time. Normal coordination.  GU: EGBUS:small nodule on mons pubis. No erythema, not fluctuant or tender. No LA in groin   Assessment:   4 weeks postpartum exam. Pap smear not done at today's visit. Folliculitis on mons pubis.   Plan:   1. Contraception: OCP (estrogen/progesterone) LoEstrin 1/20 prescribed 2. Reviewed how to shave mons. Reviewed care of folliculitis   3. Follow up in: 1 year or as needed.   Carolyn L. Harraway-Smith, M.D., Evern CoreFACOG

## 2018-08-03 ENCOUNTER — Encounter: Payer: Self-pay | Admitting: Obstetrics & Gynecology

## 2018-08-04 ENCOUNTER — Telehealth: Payer: Self-pay

## 2018-08-04 NOTE — Telephone Encounter (Signed)
Pt called the office stating that she was seen last week for her PP appt. Pt states that she had a boil that was the size of a penny but has now grown and has caused her labia to swell on one side. Pt states that she has applied heat as she was advised to at her visit but it hasn't provided any relief. Pt is scheduled to come in to be seen on 08/07/18.

## 2018-08-07 ENCOUNTER — Encounter: Payer: Self-pay | Admitting: Family Medicine

## 2018-08-07 ENCOUNTER — Ambulatory Visit (INDEPENDENT_AMBULATORY_CARE_PROVIDER_SITE_OTHER): Payer: Medicaid Other | Admitting: Family Medicine

## 2018-08-07 VITALS — BP 112/70 | HR 91 | Ht 64.0 in | Wt 163.0 lb

## 2018-08-07 DIAGNOSIS — N764 Abscess of vulva: Secondary | ICD-10-CM

## 2018-08-07 MED ORDER — SULFAMETHOXAZOLE-TRIMETHOPRIM 800-160 MG PO TABS: 1.0000 | ORAL_TABLET | Freq: Two times a day (BID) | ORAL | 0 refills | Status: AC

## 2018-08-07 NOTE — Progress Notes (Signed)
   Subjective:    Patient ID: Lisa Levy, female    DOB: 31-Aug-1991, 27 y.o.   MRN: [id number]  HPI Patient seen for follow up of mons abscess. Seen last week for PP visit. Advised to use warm compresses, which she did. The area became more tender and larger. Did spontaneously started to drain. Still quite large and tender.   Review of Systems     Objective:   Physical Exam  Constitutional: She appears well-developed and well-nourished.  Genitourinary:  Genitourinary Comments: 3cn x 5cm erythemic and indurated area on right mons, extending down the right labia. No fluctuant areas.       Assessment & Plan:  1. Labial abscess Start bactrim. Continue warm compresses. Call if continues to worsen.

## 2018-08-13 ENCOUNTER — Ambulatory Visit: Payer: Medicaid Other | Admitting: Obstetrics & Gynecology

## 2018-09-04 ENCOUNTER — Ambulatory Visit: Payer: Medicaid Other | Admitting: Family Medicine

## 2018-09-10 ENCOUNTER — Encounter: Payer: Self-pay | Admitting: Family Medicine

## 2018-09-10 ENCOUNTER — Ambulatory Visit (INDEPENDENT_AMBULATORY_CARE_PROVIDER_SITE_OTHER): Payer: Medicaid Other | Admitting: Family Medicine

## 2018-09-10 VITALS — BP 116/64 | HR 78 | Ht 64.0 in | Wt 161.1 lb

## 2018-09-10 DIAGNOSIS — N764 Abscess of vulva: Secondary | ICD-10-CM

## 2018-09-10 NOTE — Progress Notes (Signed)
   Subjective:    Patient ID: Lisa Levy, female    DOB: 08/11/1991, 27 y.o.   MRN: [id number]  HPI  Patient seen for follow up of labial abscess. Got better with antibiotics, but thinks she still feels small lump. Not painful.  Review of Systems     Objective:   Physical Exam  Constitutional: She appears well-developed and well-nourished.  Genitourinary: There is no rash, tenderness, lesion or injury on the right labia. There is no rash, tenderness, lesion or injury on the left labia.      Assessment & Plan:  1. Labial abscess All healed. I don't appreciate any lumps. Discussed normal findings with patient.

## 2018-11-08 IMAGING — US US MFM OB COMP +14 WKS
1 series · 14 of 28 positions shown · non-contrast
Comparison: none

[Series 1: us mfm ob comp +14 wks · 14 of 68 slices shown]
[im 3/68]
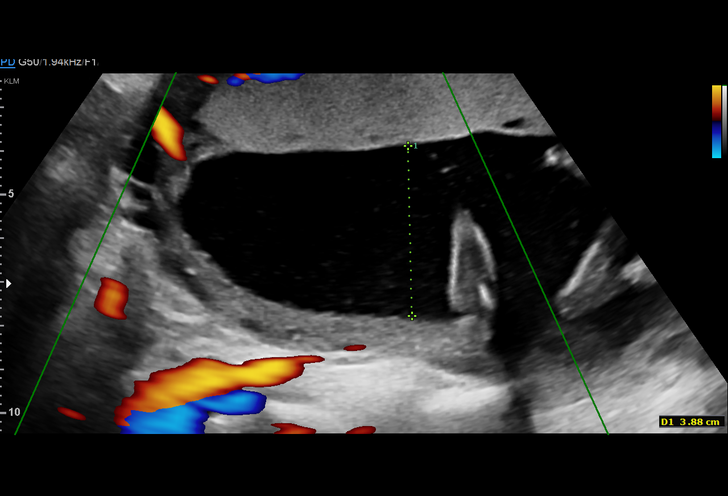
[im 8/68]
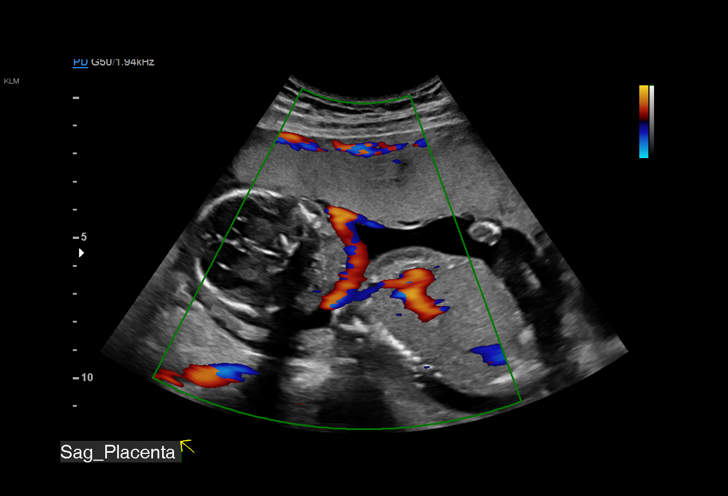
[im 13/68]
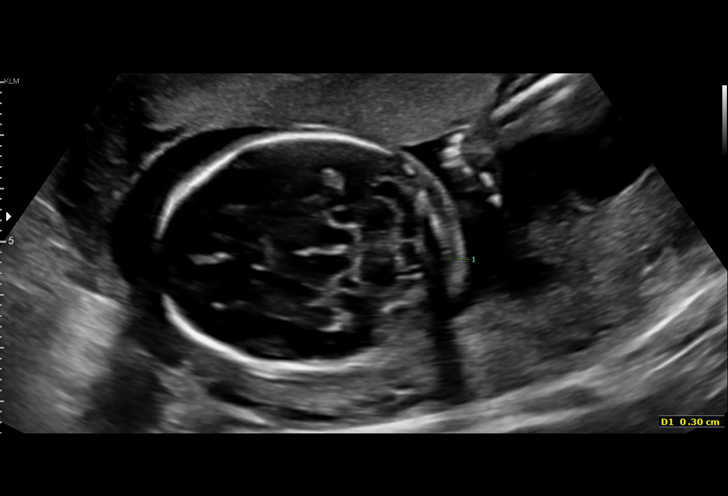
[im 18/68]
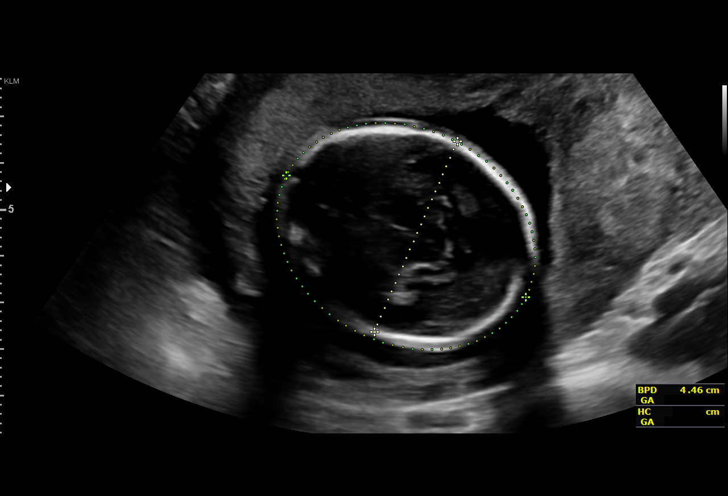
[im 23/68]
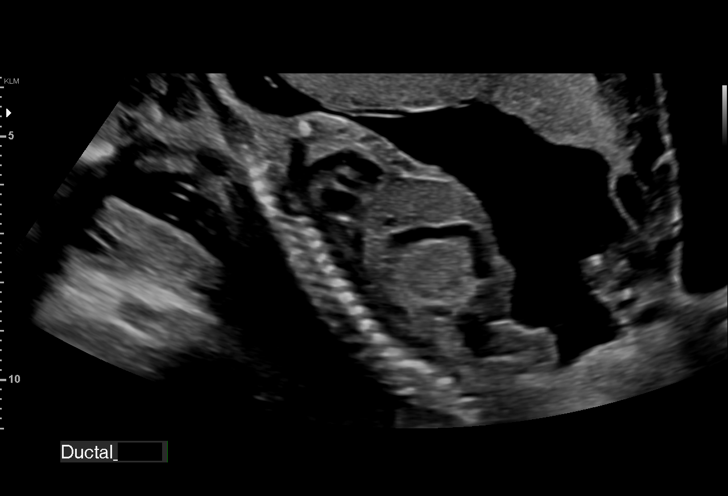
[im 28/68]
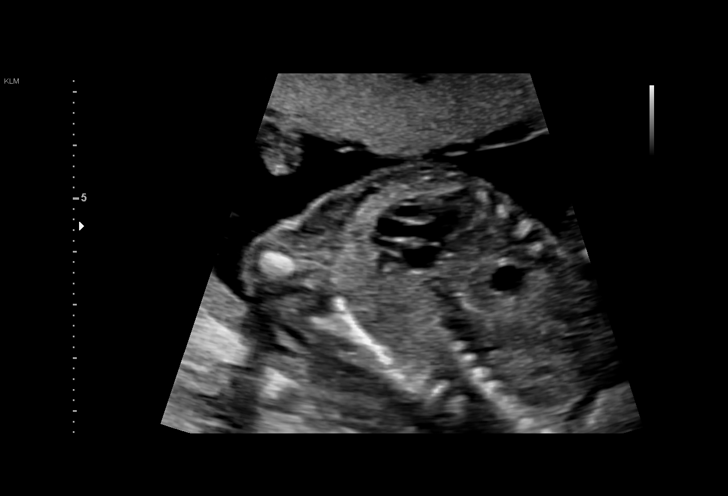
[im 33/68]
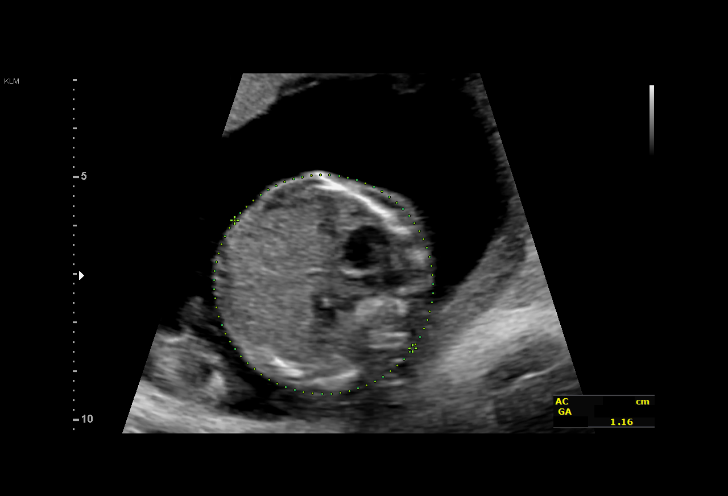
[im 38/68]
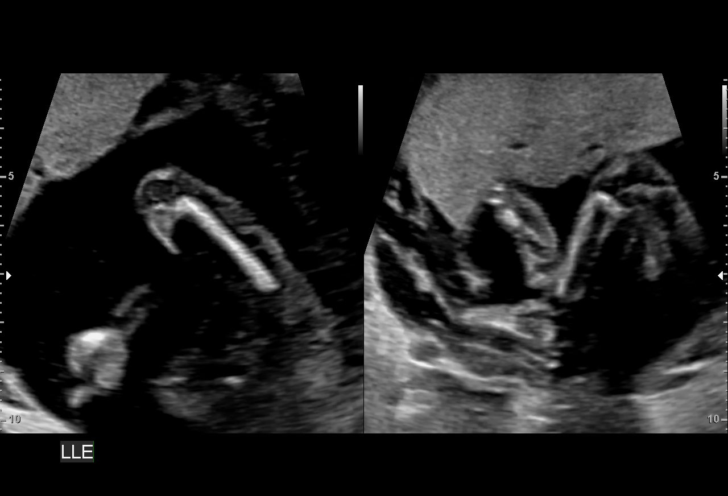
[im 43/68]
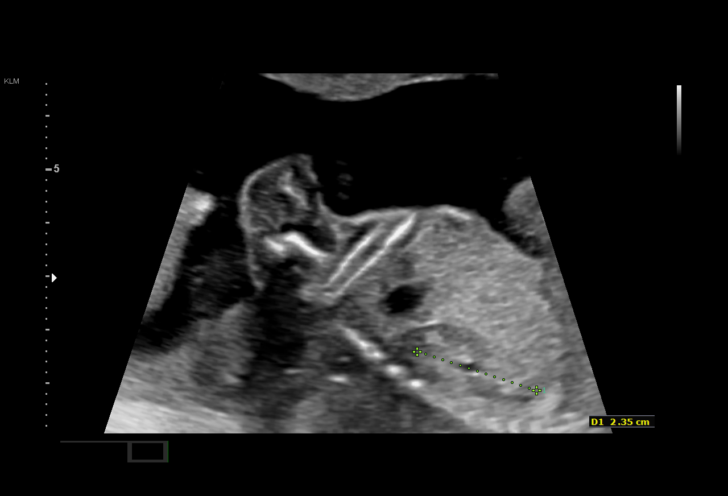
[im 48/68]
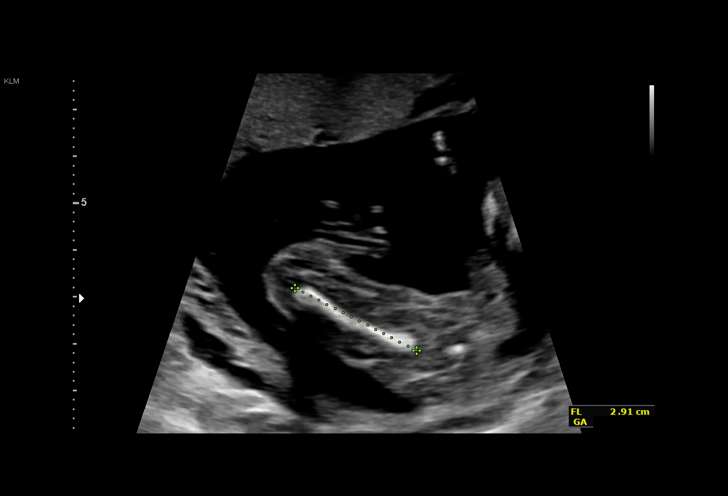
[im 53/68]
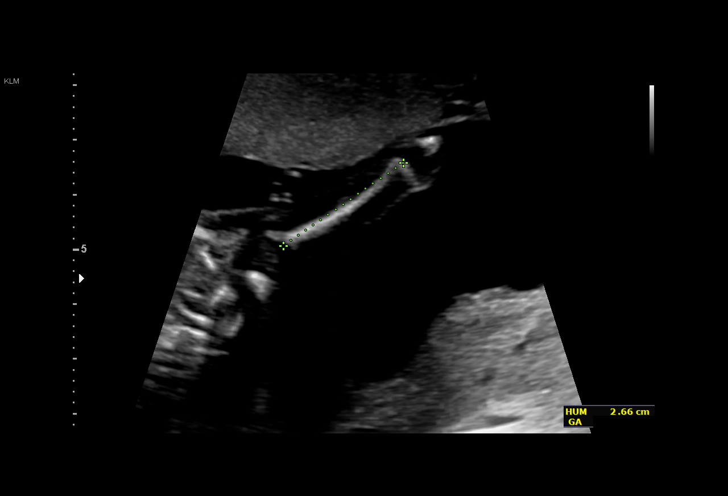
[im 58/68]
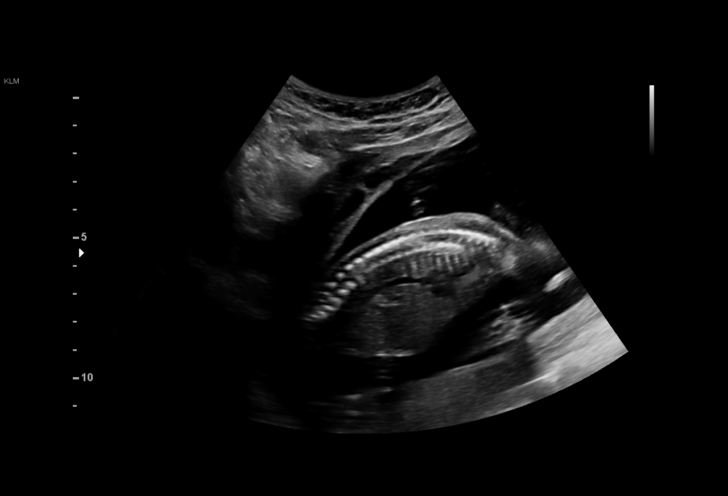
[im 63/68]
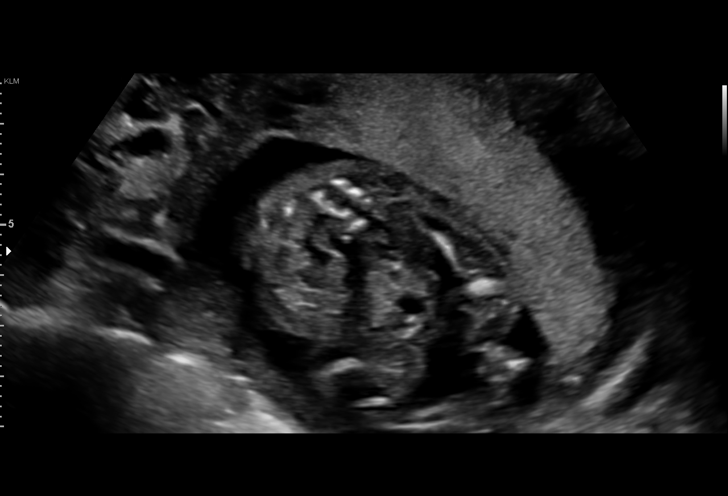
[im 68/68]
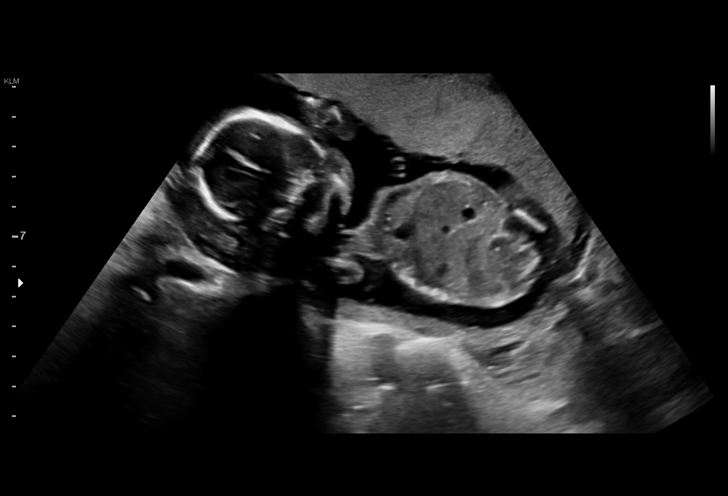

[14 of 28 positions shown; findings below may reference images not displayed]

[REDACTED]

Indications

18 weeks gestation of pregnancy
Encounter for fetal anatomic survey
Fetal Evaluation

Num Of Fetuses:     1
Fetal Heart         135
Rate(bpm):
Cardiac Activity:   Observed
Presentation:       Breech
Placenta:           Anterior, above cervical os
P. Cord Insertion:  Visualized

Amniotic Fluid
AFI FV:      Subjectively within normal limits
Biometry

BPD:      44.2  mm     G. Age:  19w 3d         94  %    CI:        74.29   %    70 - 86
FL/HC:      17.1   %    15.8 - 18
HC:      162.8  mm     G. Age:  19w 1d         87  %    HC/AC:      1.17        1.07 -
AC:      139.6  mm     G. Age:  19w 3d         86  %    FL/BPD:     62.9   %
FL:       27.8  mm     G. Age:  18w 4d         63  %    FL/AC:      19.9   %    20 - 24
HUM:      26.1  mm     G. Age:  18w 1d         62  %
CER:      20.9  mm     G. Age:  19w 6d         94  %

Est. FW:     267  gm      0 lb 9 oz     65  %
Gestational Age

LMP:           14w 4d        Date:  10/17/17                 EDD:   07/24/18
U/S Today:     19w 1d                                        EDD:   06/22/18
Best:          18w 0d     Det. By:  Early Ultrasound         EDD:   06/30/18
(01/13/18)
Anatomy

Cranium:               Appears normal         Aortic Arch:            Appears normal
Cavum:                 Appears normal         Ductal Arch:            Appears normal
Ventricles:            Appears normal         Diaphragm:              Appears normal
Choroid Plexus:        Appears normal         Stomach:                Appears normal, left
sided
Cerebellum:            Appears normal         Abdomen:                Appears normal
Posterior Fossa:       Appears normal         Abdominal Wall:         Appears nml (cord
insert, abd wall)
Nuchal Fold:           Appears normal         Cord Vessels:           Appears normal (3
vessel cord)
Face:                  Appears normal         Kidneys:                Appear normal
(orbits and profile)
Lips:                  Appears normal         Bladder:                Appears normal
Thoracic:              Appears normal         Spine:                  Appears normal
Heart:                 Appears normal         Upper Extremities:      Appears normal
(4CH, axis, and
situs)
RVOT:                  Appears normal         Lower Extremities:      Appears normal
LVOT:                  Appears normal

Other:  Fetus appears to be a female. Heels visualized. Nasal bone
visualized.
Cervix Uterus Adnexa

Cervix
Length:            3.2  cm.
Normal appearance by transabdominal scan.
Impression

Singleton intrauterine pregnancy at 18 weeks 0 days
gestation with fetal cardiac activity
Breech presentation
Normal detailed fetal anatomy
Markers of aneuploidy: none
Normal amniotic fluid volume
Measurements consistent with early US
Anterior placenta
Normal appearing cervical length
Recommendations

Follow-up as clinically indicated

## 2019-07-01 ENCOUNTER — Other Ambulatory Visit: Payer: Self-pay

## 2019-07-01 ENCOUNTER — Other Ambulatory Visit (HOSPITAL_COMMUNITY)
Admission: RE | Admit: 2019-07-01 | Discharge: 2019-07-01 | Disposition: A | Discharge status: Home / Self Care | Payer: Medicaid Other | Source: Ambulatory Visit | Attending: Family Medicine | Admitting: Family Medicine

## 2019-07-01 ENCOUNTER — Ambulatory Visit (INDEPENDENT_AMBULATORY_CARE_PROVIDER_SITE_OTHER): Payer: Medicaid Other | Admitting: Family Medicine

## 2019-07-01 ENCOUNTER — Encounter: Payer: Self-pay | Admitting: Family Medicine

## 2019-07-01 DIAGNOSIS — Z3A08 8 weeks gestation of pregnancy: Secondary | ICD-10-CM | POA: Diagnosis not present

## 2019-07-01 DIAGNOSIS — Z3481 Encounter for supervision of other normal pregnancy, first trimester: Secondary | ICD-10-CM | POA: Diagnosis not present

## 2019-07-01 DIAGNOSIS — Z3480 Encounter for supervision of other normal pregnancy, unspecified trimester: Secondary | ICD-10-CM | POA: Insufficient documentation

## 2019-07-01 NOTE — Progress Notes (Signed)
DATING AND VIABILITY SONOGRAM   Lisa Levy is a 28 y.o. year old G2P1001 with LMP Patient's last menstrual period was 05/17/2019 (exact date). which would correlate to  [redacted]w[redacted]d weeks gestation.  She has irregular menstrual cycles.   She is here today for a confirmatory initial sonogram.    GESTATION: SINGLETON     FETAL ACTIVITY:          Heart rate     170          The fetus is active.   GESTATIONAL AGE AND  BIOMETRICS:  Gestational criteria: Estimated Date of Delivery: 01/30/20 by early ultrasound now at [redacted]w[redacted]d  Previous Scans:0  GESTATIONAL SAC                     CROWN RUMP LENGTH          2.87 mm         9.4 weeks                                                   AVERAGE EGA(BY THIS SCAN):   9.4 weeks  WORKING EDD( early ultrasound ):  01/30/2020     TECHNICIAN COMMENTS: Patient informed that the ultrasound is considered a limited obstetric ultrasound and is not intended to be a complete ultrasound exam. Patient also informed that the ultrasound is not being completed with the intent of assessing for fetal or placental anomalies or any pelvic abnormalities. Explained that the purpose of today's ultrasound is to assess for fetal heart rate. Patient acknowledges the purpose of the exam and the limitations of the study.      A copy of this report including all images has been saved and backed up to a second source for retrieval if needed. All measures and details of the anatomical scan, placentation, fluid volume and pelvic anatomy are contained in that report.  Crosby Oyster 07/01/2019 4:16 PM

## 2019-07-01 NOTE — Progress Notes (Signed)
  Subjective:  Lisa Levy is a G2P1001 [redacted]w[redacted]d being seen today for her first obstetrical visit.  Her obstetrical history is significant for prior low risk, uncomplicated vaginal delivery. Patient does intend to breast feed. Pregnancy history fully reviewed.  Patient reports no complaints.  BP 113/70   Pulse 90   Wt 154 lb (69.9 kg)   LMP 05/17/2019 (Exact Date)   BMI 26.43 kg/m   HISTORY: OB History  Gravida Para Term Preterm AB Living  2 1 1     1   SAB TAB Ectopic Multiple Live Births        0 1    # Outcome Date GA Lbr Len/2nd Weight Sex Delivery Anes PTL Lv  2 Current           1 Term 06/29/18 [redacted]w[redacted]d 12:23 / 00:33 8 lb 4.5 oz (3.756 kg) F Vag-Spont None  LIV     Birth Comments: WNL    Past Medical History:  Diagnosis Date  . Head trauma     Past Surgical History:  Procedure Laterality Date  . BRAIN SURGERY      Family History  Problem Relation Age of Onset  . Cancer Paternal Grandmother   . Diabetes Neg Hx   . Hypertension Neg Hx   . Stroke Neg Hx      Exam  BP 113/70   Pulse 90   Wt 154 lb (69.9 kg)   LMP 05/17/2019 (Exact Date)   BMI 26.43 kg/m   CONSTITUTIONAL: Well-developed, well-nourished female in no acute distress.  HENT:  Normocephalic, atraumatic, External right and left ear normal. Oropharynx is clear and moist EYES: Conjunctivae and EOM are normal. Pupils are equal, round, and reactive to light. No scleral icterus.  NECK: Normal range of motion, supple, no masses.  Normal thyroid.  CARDIOVASCULAR: Normal heart rate noted, regular rhythm RESPIRATORY: Clear to auscultation bilaterally. Effort and breath sounds normal, no problems with respiration noted. BREASTS: Symmetric in size. No masses, skin changes, nipple drainage, or lymphadenopathy. ABDOMEN: Soft, normal bowel sounds, no distention noted.  No tenderness, rebound or guarding.  PELVIC: Normal appearing external genitalia; normal appearing vaginal mucosa and cervix. No abnormal discharge  noted. Normal uterine size, no other palpable masses, no uterine or adnexal tenderness. MUSCULOSKELETAL: Normal range of motion. No tenderness.  No cyanosis, clubbing, or edema.  2+ distal pulses. SKIN: Skin is warm and dry. No rash noted. Not diaphoretic. No erythema. No pallor. NEUROLOGIC: Alert and oriented to person, place, and time. Normal reflexes, muscle tone coordination. No cranial nerve deficit noted. PSYCHIATRIC: Normal mood and affect. Normal behavior. Normal judgment and thought content.    Assessment:    Pregnancy: G2P1001 Patient Active Problem List   Diagnosis Date Noted  . Supervision of other normal pregnancy, antepartum 07/01/2019  . Uterine contractions during pregnancy 06/29/2018  . 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 Supervision of other normal pregnancy, antepartum FHT and FH normal Singleton pregnancy Dating by [place]: [redacted]w[redacted]d today. Declines panorama.  - Culture, OB Urine - Obstetric Panel, Including HIV - GC/Chlamydia probe amp ([place])not at [facility]     Problem list reviewed and updated. 75% of 30 min visit spent on counseling and coordination of care.     Truett Mainland 07/01/2019

## 2019-07-04 LAB — URINE CULTURE, OB REFLEX: Organism ID, Bacteria: NO GROWTH

## 2019-07-04 LAB — CULTURE, OB URINE

## 2019-07-07 LAB — GC/CHLAMYDIA PROBE AMP (~~LOC~~) NOT AT ARMC
Chlamydia: NEGATIVE
Neisseria Gonorrhea: NEGATIVE

## 2019-07-13 ENCOUNTER — Telehealth: Payer: Self-pay

## 2019-07-13 MED ORDER — PRENATAL PLUS 27-1 MG PO TABS: 1.0000 | ORAL_TABLET | Freq: Every day | ORAL | 12 refills | Status: DC

## 2019-07-13 NOTE — Telephone Encounter (Signed)
Patient called and would like to have prenatal vitamin called in. Kathrene Alu RN

## 2019-08-04 ENCOUNTER — Encounter: Payer: Self-pay | Admitting: Medical

## 2019-08-04 ENCOUNTER — Telehealth (INDEPENDENT_AMBULATORY_CARE_PROVIDER_SITE_OTHER): Payer: Medicaid Other | Admitting: Medical

## 2019-08-04 ENCOUNTER — Telehealth: Payer: Medicaid Other | Admitting: Medical

## 2019-08-04 ENCOUNTER — Other Ambulatory Visit: Payer: Self-pay

## 2019-08-04 DIAGNOSIS — Z3480 Encounter for supervision of other normal pregnancy, unspecified trimester: Secondary | ICD-10-CM

## 2019-08-04 DIAGNOSIS — Z3A14 14 weeks gestation of pregnancy: Secondary | ICD-10-CM

## 2019-08-04 DIAGNOSIS — Z3482 Encounter for supervision of other normal pregnancy, second trimester: Secondary | ICD-10-CM

## 2019-08-04 DIAGNOSIS — Z3689 Encounter for other specified antenatal screening: Secondary | ICD-10-CM

## 2019-08-04 NOTE — Progress Notes (Signed)
Pt has not gotten her B/P cuff yet and needs to get a scale

## 2019-08-04 NOTE — Progress Notes (Signed)
I connected with Lisa Levy on 08/04/19 at  3:30 PM EDT by: Doximity video, when MyChart failed and verified that I am speaking with the correct person using two identifiers.  Patient is located at home and provider is located at [place].     The purpose of this virtual visit is to provide medical care while limiting exposure to the novel coronavirus. I discussed the limitations, risks, security and privacy concerns of performing an evaluation and management service by Doximity video and the availability of in person appointments. I also discussed with the patient that there may be a patient responsible charge related to this service. By engaging in this virtual visit, you consent to the provision of healthcare.  Additionally, you authorize for your insurance to be billed for the services provided during this visit.  The patient expressed understanding and agreed to proceed.  The following staff members participated in the virtual visit:  Wendelyn Breslow, CMA    PRENATAL VISIT NOTE  Subjective:  Lisa Levy is a 28 y.o. G2P1001 at [redacted]w[redacted]d  for phone visit for ongoing prenatal care.  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  Patient reports no complaints.  Contractions: Not present. Vag. Bleeding: None.  Movement: Absent. Denies leaking of fluid.   The following portions of the patient's history were reviewed and updated as appropriate: allergies, current medications, past family history, past medical history, past social history, past surgical history and problem list.   Objective:  There were no vitals filed for this visit. Self-Obtained  Fetal Status:     Movement: Absent     Assessment and Plan:  Pregnancy: G2P1001 at [redacted]w[redacted]d 1. Supervision of other normal pregnancy, antepartum - Babyscripts Schedule Optimization  - Baby Rx will send BP cuff - [place] MFM OB COMP + 57 WK; Scheduled  - Patient did not have OB panel drawn after last visit, CMA to call patient to come to [facility] lab for blood draw as soon as possible   Preterm labor symptoms and general obstetric precautions including but not limited to vaginal bleeding, contractions, leaking of fluid and fetal movement were reviewed in detail with the patient.  Return in about 6 weeks (around 09/15/2019) for LOB, Virtual.  Future Appointments  Date Time Provider [place]  08/04/2019  3:30 PM Danielle Rankin [facility] None  09/03/2019  9:15 AM Truett Mainland, DO [facility] None     Time spent on virtual visit: 10 minutes  Kerry Hough, PA-C

## 2019-08-04 NOTE — Patient Instructions (Signed)

## 2019-08-05 ENCOUNTER — Other Ambulatory Visit: Payer: Self-pay

## 2019-08-05 DIAGNOSIS — Z3480 Encounter for supervision of other normal pregnancy, unspecified trimester: Secondary | ICD-10-CM

## 2019-08-05 MED ORDER — AMBULATORY NON FORMULARY MEDICATION: 1.0000 | 0 refills | Status: DC

## 2019-08-05 NOTE — Progress Notes (Signed)
BP order faxed to [facility]. Deema Juncaj l Nicky Milhouse, CMA

## 2019-08-13 ENCOUNTER — Ambulatory Visit: Payer: Medicaid Other

## 2019-08-20 ENCOUNTER — Other Ambulatory Visit: Payer: Self-pay

## 2019-08-20 ENCOUNTER — Ambulatory Visit: Payer: Medicaid Other

## 2019-08-20 DIAGNOSIS — Z3480 Encounter for supervision of other normal pregnancy, unspecified trimester: Secondary | ICD-10-CM

## 2019-08-20 NOTE — Progress Notes (Signed)
Pt was given lab sheet and sent to the lab to have OB panel drawn.  Demetric Parslow l Hasnain Manheim, CMA

## 2019-08-21 LAB — OBSTETRIC PANEL, INCLUDING HIV
Antibody Screen: NEGATIVE
Basophils Absolute: 0 10*3/uL (ref 0.0–0.2)
Basos: 1 %
EOS (ABSOLUTE): 0.1 10*3/uL (ref 0.0–0.4)
Eos: 1 %
HIV Screen 4th Generation wRfx: NONREACTIVE
Hematocrit: 35.6 % (ref 34.0–46.6)
Hemoglobin: 11.7 g/dL (ref 11.1–15.9)
Hepatitis B Surface Ag: NEGATIVE
Immature Grans (Abs): 0 10*3/uL (ref 0.0–0.1)
Immature Granulocytes: 0 %
Lymphocytes Absolute: 1.7 10*3/uL (ref 0.7–3.1)
Lymphs: 20 %
MCH: 32.1 pg (ref 26.6–33.0)
MCHC: 32.9 g/dL (ref 31.5–35.7)
MCV: 98 fL — ABNORMAL HIGH (ref 79–97)
Monocytes Absolute: 0.3 10*3/uL (ref 0.1–0.9)
Monocytes: 4 %
Neutrophils Absolute: 6.1 10*3/uL (ref 1.4–7.0)
Neutrophils: 74 %
Platelets: 213 10*3/uL (ref 150–450)
RBC: 3.65 x10E6/uL — ABNORMAL LOW (ref 3.77–5.28)
RDW: 12.1 % (ref 11.7–15.4)
RPR Ser Ql: NONREACTIVE
Rh Factor: POSITIVE
Rubella Antibodies, IGG: 1.55 index (ref >0.99)
WBC: 8.2 10*3/uL (ref 3.4–10.8)

## 2019-09-03 ENCOUNTER — Other Ambulatory Visit: Payer: Self-pay

## 2019-09-03 ENCOUNTER — Ambulatory Visit (INDEPENDENT_AMBULATORY_CARE_PROVIDER_SITE_OTHER): Payer: Medicaid Other | Admitting: Family Medicine

## 2019-09-03 DIAGNOSIS — Z3A18 18 weeks gestation of pregnancy: Secondary | ICD-10-CM

## 2019-09-03 DIAGNOSIS — Z3482 Encounter for supervision of other normal pregnancy, second trimester: Secondary | ICD-10-CM

## 2019-09-03 DIAGNOSIS — Z3480 Encounter for supervision of other normal pregnancy, unspecified trimester: Secondary | ICD-10-CM

## 2019-09-03 NOTE — Progress Notes (Signed)
   PRENATAL VISIT NOTE  Subjective:  Lisa Levy is a 28 y.o. G2P1001 at [redacted]w[redacted]d being seen today for ongoing prenatal care.  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  Patient reports no complaints.  Contractions: Not present. Vag. Bleeding: None.  Movement: Present. Denies leaking of fluid.   The following portions of the patient's history were reviewed and updated as appropriate: allergies, current medications, past family history, past medical history, past social history, past surgical history and problem list.   Objective:   Vitals:   09/03/19 0909  BP: 104/67  Pulse: 91  Weight: 154 lb 1.3 oz (69.9 kg)    Fetal Status: Fetal Heart Rate (bpm): 153 Fundal Height: 19 cm Movement: Present     General:  Alert, oriented and cooperative. Patient is in no acute distress.  Skin: Skin is warm and dry. No rash noted.   Cardiovascular: Normal heart rate noted  Respiratory: Normal respiratory effort, no problems with respiration noted  Abdomen: Soft, gravid, appropriate for gestational age.  Pain/Pressure: Absent     Pelvic: Cervical exam deferred        Extremities: Normal range of motion.  Edema: None  Mental Status: Normal mood and affect. Normal behavior. Normal judgment and thought content.   Assessment and Plan:  Pregnancy: G2P1001 at [redacted]w[redacted]d 1. Supervision of other normal pregnancy, antepartum FHT and FH normal. [place] next week. Declined Flu shot  Preterm labor symptoms and general obstetric precautions including but not limited to vaginal bleeding, contractions, leaking of fluid and fetal movement were reviewed in detail with the patient. Please refer to After Visit Summary for other counseling recommendations.   Return in about 4 weeks (around 10/01/2019) for OB f/u, Virtual.  Future Appointments   Date Time Provider [place]  09/10/2019  2:30 PM [facility] [place] 1 [facility]  10/04/2019  9:45 AM Nugent, Gerrie Nordmann, NP [facility] None    Truett Mainland, DO

## 2019-09-06 ENCOUNTER — Ambulatory Visit (HOSPITAL_COMMUNITY): Payer: Medicaid Other

## 2019-09-10 ENCOUNTER — Ambulatory Visit (HOSPITAL_COMMUNITY): Payer: Medicaid Other

## 2019-09-15 ENCOUNTER — Other Ambulatory Visit: Payer: Self-pay

## 2019-09-15 ENCOUNTER — Ambulatory Visit (HOSPITAL_COMMUNITY)
Admission: RE | Admit: 2019-09-15 | Discharge: 2019-09-15 | Disposition: A | Discharge status: Home / Self Care | Payer: Medicaid Other | Source: Ambulatory Visit | Attending: Medical | Admitting: Medical

## 2019-09-15 ENCOUNTER — Other Ambulatory Visit: Payer: Self-pay | Admitting: Medical

## 2019-09-15 DIAGNOSIS — Z3A20 20 weeks gestation of pregnancy: Secondary | ICD-10-CM | POA: Diagnosis not present

## 2019-09-15 DIAGNOSIS — Z3689 Encounter for other specified antenatal screening: Secondary | ICD-10-CM | POA: Diagnosis present

## 2019-09-15 DIAGNOSIS — Z3480 Encounter for supervision of other normal pregnancy, unspecified trimester: Secondary | ICD-10-CM | POA: Diagnosis present

## 2019-09-15 DIAGNOSIS — O359XX0 Maternal care for (suspected) fetal abnormality and damage, unspecified, not applicable or unspecified: Secondary | ICD-10-CM | POA: Diagnosis not present

## 2019-10-04 ENCOUNTER — Ambulatory Visit: Payer: Medicaid Other | Admitting: Women's Health

## 2019-10-04 VITALS — Wt 162.0 lb

## 2019-10-04 DIAGNOSIS — Z3480 Encounter for supervision of other normal pregnancy, unspecified trimester: Secondary | ICD-10-CM

## 2019-10-04 NOTE — Progress Notes (Signed)
Attempted to connect with patient via WebEx. Provider was able to hear patient but not see patient on video. Provider connected to audio via computer which was working per audio checks, but patient could not hear provider. Video was not functioning. Provider changed computers and relogged in to WebEx and patient was no longer connected. Nurse attempted to call patient to assist her with logging back on 5-6 times, but patient did not answer. Unable to connect with patient.  Clarisa Fling, NP  1:09 PM 10/04/2019

## 2019-10-04 NOTE — Patient Instructions (Addendum)
The Maternity Assessment Unit (MAU) is located at the [facility] and [place] at [facility] The address is: [address], [place], [place], [address] [address]. Please see map below for additional directions.    The Maternity Assessment Unit is designed to help you during your pregnancy, and for up to 6 weeks after delivery, with any pregnancy- or postpartum-related emergencies, if you think you are in labor, or if your water has broken. For example, if you experience nausea and vomiting, vaginal bleeding, severe abdominal or pelvic pain, elevated blood pressure or other problems related to your pregnancy or postpartum time, please come to the Maternity Assessment Unit for assistance.   Second Trimester of Pregnancy The second trimester is from week 14 through week 27 (months 4 through 6). The second trimester is often a time when you feel your best. Your body has adjusted to being pregnant, and you begin to feel better physically. Usually, morning sickness has lessened or quit completely, you may have more energy, and you may have an increase in appetite. The second trimester is also a time when the fetus is growing rapidly. At the end of the sixth month, the fetus is about 9 inches long and weighs about 1 pounds. You will likely begin to feel the baby move (quickening) between 16 and 20 weeks of pregnancy. Body changes during your second trimester Your body continues to go through many changes during your second trimester. The changes vary from woman to woman.  Your weight will continue to increase. You will notice your lower abdomen bulging out.  You may begin to get stretch marks on your hips, abdomen, and breasts.  You may develop headaches that can be relieved by medicines. The medicines should be approved by your health care provider.  You may urinate more often because the fetus is pressing on your bladder.  You may develop or continue to have heartburn as a result  of your pregnancy.  You may develop constipation because certain hormones are causing the muscles that push waste through your intestines to slow down.  You may develop hemorrhoids or swollen, bulging veins (varicose veins).  You may have back pain. This is caused by: ? Weight gain. ? Pregnancy hormones that are relaxing the joints in your pelvis. ? A shift in weight and the muscles that support your balance.  Your breasts will continue to grow and they will continue to become tender.  Your gums may bleed and may be sensitive to brushing and flossing.  Dark spots or blotches (chloasma, mask of pregnancy) may develop on your face. This will likely fade after the baby is born.  A dark line from your belly button to the pubic area (linea nigra) may appear. This will likely fade after the baby is born.  You may have changes in your hair. These can include thickening of your hair, rapid growth, and changes in texture. Some women also have hair loss during or after pregnancy, or hair that feels dry or thin. Your hair will most likely return to normal after your baby is born. What to expect at prenatal visits During a routine prenatal visit:  You will be weighed to make sure you and the fetus are growing normally.  Your blood pressure will be taken.  Your abdomen will be measured to track your baby's growth.  The fetal heartbeat will be listened to.  Any test results from the previous visit will be discussed. Your health care provider may ask you:  How you are feeling.  If you are feeling the baby move.  If you have had any abnormal symptoms, such as leaking fluid, bleeding, severe headaches, or abdominal cramping.  If you are using any tobacco products, including cigarettes, chewing tobacco, and electronic cigarettes.  If you have any questions. Other tests that may be performed during your second trimester include:  Blood tests that check for: ? Low iron levels (anemia). ?  High blood sugar that affects pregnant women (gestational diabetes) between 5024 and 28 weeks. ? Rh antibodies. This is to check for a protein on red blood cells (Rh factor).  Urine tests to check for infections, diabetes, or protein in the urine.  An ultrasound to confirm the proper growth and development of the baby.  An amniocentesis to check for possible genetic problems.  Fetal screens for spina bifida and Down syndrome.  HIV (human immunodeficiency virus) testing. Routine prenatal testing includes screening for HIV, unless you choose not to have this test. Follow these instructions at home: Medicines  Follow your health care provider's instructions regarding medicine use. Specific medicines may be either safe or unsafe to take during pregnancy.  Take a prenatal vitamin that contains at least 600 micrograms (mcg) of folic acid.  If you develop constipation, try taking a stool softener if your health care provider approves. Eating and drinking   Eat a balanced diet that includes fresh fruits and vegetables, whole grains, good sources of protein such as meat, eggs, or tofu, and low-fat dairy. Your health care provider will help you determine the amount of weight gain that is right for you.  Avoid raw meat and uncooked cheese. These carry germs that can cause birth defects in the baby.  If you have low calcium intake from food, talk to your health care provider about whether you should take a daily calcium supplement.  Limit foods that are high in fat and processed sugars, such as fried and sweet foods.  To prevent constipation: ? Drink enough fluid to keep your urine clear or pale yellow. ? Eat foods that are high in fiber, such as fresh fruits and vegetables, whole grains, and beans. Activity  Exercise only as directed by your health care provider. Most women can continue their usual exercise routine during pregnancy. Try to exercise for 30 minutes at least 5 days a week. Stop  exercising if you experience uterine contractions.  Avoid heavy lifting, wear low heel shoes, and practice good posture.  A sexual relationship may be continued unless your health care provider directs you otherwise. Relieving pain and discomfort  Wear a good support bra to prevent discomfort from breast tenderness.  Take warm sitz baths to soothe any pain or discomfort caused by hemorrhoids. Use hemorrhoid cream if your health care provider approves.  Rest with your legs elevated if you have leg cramps or low back pain.  If you develop varicose veins, wear support hose. Elevate your feet for 15 minutes, 3-4 times a day. Limit salt in your diet. Prenatal Care  Write down your questions. Take them to your prenatal visits.  Keep all your prenatal visits as told by your health care provider. This is important. Safety  Wear your seat belt at all times when driving.  Make a list of emergency phone numbers, including numbers for family, friends, the hospital, and police and fire departments. General instructions  Ask your health care provider for a referral to a local prenatal education class. Begin classes no later than the beginning  of month 6 of your pregnancy.  Ask for help if you have counseling or nutritional needs during pregnancy. Your health care provider can offer advice or refer you to specialists for help with various needs.  Do not use hot tubs, steam rooms, or saunas.  Do not douche or use tampons or scented sanitary pads.  Do not cross your legs for long periods of time.  Avoid cat litter boxes and soil used by cats. These carry germs that can cause birth defects in the baby and possibly loss of the fetus by miscarriage or stillbirth.  Avoid all smoking, herbs, alcohol, and unprescribed drugs. Chemicals in these products can affect the formation and growth of the baby.  Do not use any products that contain nicotine or tobacco, such as cigarettes and e-cigarettes. If  you need help quitting, ask your health care provider.  Visit your dentist if you have not gone yet during your pregnancy. Use a soft toothbrush to brush your teeth and be gentle when you floss. Contact a health care provider if:  You have dizziness.  You have mild pelvic cramps, pelvic pressure, or nagging pain in the abdominal area.  You have persistent nausea, vomiting, or diarrhea.  You have a bad smelling vaginal discharge.  You have pain when you urinate. Get help right away if:  You have a fever.  You are leaking fluid from your vagina.  You have spotting or bleeding from your vagina.  You have severe abdominal cramping or pain.  You have rapid weight gain or weight loss.  You have shortness of breath with chest pain.  You notice sudden or extreme swelling of your face, hands, ankles, feet, or legs.  You have not felt your baby move in over an hour.  You have severe headaches that do not go away when you take medicine.  You have vision changes. Summary  The second trimester is from week 14 through week 27 (months 4 through 6). It is also a time when the fetus is growing rapidly.  Your body goes through many changes during pregnancy. The changes vary from woman to woman.  Avoid all smoking, herbs, alcohol, and unprescribed drugs. These chemicals affect the formation and growth your baby.  Do not use any tobacco products, such as cigarettes, chewing tobacco, and e-cigarettes. If you need help quitting, ask your health care provider.  Contact your health care provider if you have any questions. Keep all prenatal visits as told by your health care provider. This is important. This information is not intended to replace advice given to you by your health care provider. Make sure you discuss any questions you have with your health care provider. Document Released: 12/03/2001 Document Revised: 04/02/2019 Document Reviewed: 01/14/2017 Elsevier Patient Education  2020  [employer].

## 2019-11-16 ENCOUNTER — Encounter: Payer: Self-pay | Admitting: Nurse Practitioner

## 2019-11-16 ENCOUNTER — Other Ambulatory Visit: Payer: Self-pay

## 2019-11-16 ENCOUNTER — Ambulatory Visit (INDEPENDENT_AMBULATORY_CARE_PROVIDER_SITE_OTHER): Payer: Medicaid Other | Admitting: Nurse Practitioner

## 2019-11-16 VITALS — BP 115/58 | HR 87 | Wt 164.0 lb

## 2019-11-16 DIAGNOSIS — Z3483 Encounter for supervision of other normal pregnancy, third trimester: Secondary | ICD-10-CM

## 2019-11-16 DIAGNOSIS — Z3A39 39 weeks gestation of pregnancy: Secondary | ICD-10-CM

## 2019-11-16 DIAGNOSIS — Z3480 Encounter for supervision of other normal pregnancy, unspecified trimester: Secondary | ICD-10-CM

## 2019-11-16 NOTE — Progress Notes (Signed)
    Subjective:  Lisa Levy is a 28 y.o. G2P1001 at [redacted]w[redacted]d being seen today for ongoing prenatal care.  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  Patient reports no complaints.  Contractions: Not present. Vag. Bleeding: None.  Movement: Present. Denies leaking of fluid.   The following portions of the patient's history were reviewed and updated as appropriate: allergies, current medications, past family history, past medical history, past social history, past surgical history and problem list. Problem list updated.  Objective:   Vitals:   11/16/19 0832  BP: (!) 115/58  Pulse: 87  Weight: 164 lb (74.4 kg)    Fetal Status: Fetal Heart Rate (bpm): 152 Fundal Height: 30 cm Movement: Present     General:  Alert, oriented and cooperative. Patient is in no acute distress.  Skin: Skin is warm and dry. No rash noted.   Cardiovascular: Normal heart rate noted  Respiratory: Normal respiratory effort, no problems with respiration noted  Abdomen: Soft, gravid, appropriate for gestational age. Pain/Pressure: Absent     Pelvic:  Cervical exam deferred        Extremities: Normal range of motion.  Edema: None  Mental Status: Normal mood and affect. Normal behavior. Normal judgment and thought content.   Urinalysis:      Assessment and Plan:  Pregnancy: G2P1001 at [redacted]w[redacted]d  1. Supervision of other normal pregnancy, antepartum Reviewed risks of whooping cough and that is why TDAP is recommended in pregnancy.  Offered flu vaccine again but client declines both. Wanted home birth Wanted water birth Encouraged to use birthing ball for labor and advised that midwives in our practice are committed to a great birth experience for her. Does not yet have BP cuff - will order today and advised to take BP weekly and record in Babyscripts  - Glucose Tolerance, 2  Hours w/1 Hour - CBC - HIV antibody (with reflex) - RPR  Preterm labor symptoms and general obstetric precautions including but not limited to vaginal bleeding, contractions, leaking of fluid and fetal movement were reviewed in detail with the patient. Please refer to After Visit Summary for other counseling recommendations.  Return in about 2 weeks (around 11/30/2019) for virtlual ROB - MyChart.  Earlie Server, RN, MSN, NP-BC Nurse Practitioner, [facility] Inpatient for [employer], [place] [facility] 11/16/2019 9:08 AM

## 2019-11-16 NOTE — Patient Instructions (Signed)

## 2019-11-17 LAB — CBC
Hematocrit: 36.4 % (ref 34.0–46.6)
Hemoglobin: 11.7 g/dL (ref 11.1–15.9)
MCH: 31 pg (ref 26.6–33.0)
MCHC: 32.1 g/dL (ref 31.5–35.7)
MCV: 97 fL (ref 79–97)
Platelets: 242 10*3/uL (ref 150–450)
RBC: 3.77 x10E6/uL (ref 3.77–5.28)
RDW: 12.8 % (ref 11.7–15.4)
WBC: 10.8 10*3/uL (ref 3.4–10.8)

## 2019-11-17 LAB — GLUCOSE TOLERANCE, 2 HOURS W/ 1HR
Glucose, 1 hour: 135 mg/dL (ref 65–179)
Glucose, 2 hour: 42 mg/dL — ABNORMAL LOW (ref 65–152)
Glucose, Fasting: 76 mg/dL (ref 65–91)

## 2019-11-17 LAB — HIV ANTIBODY (ROUTINE TESTING W REFLEX): HIV Screen 4th Generation wRfx: NONREACTIVE

## 2019-11-17 LAB — RPR: RPR Ser Ql: NONREACTIVE

## 2019-12-14 ENCOUNTER — Encounter: Payer: Self-pay | Admitting: Advanced Practice Midwife

## 2019-12-14 ENCOUNTER — Other Ambulatory Visit: Payer: Self-pay

## 2019-12-14 ENCOUNTER — Ambulatory Visit (INDEPENDENT_AMBULATORY_CARE_PROVIDER_SITE_OTHER): Payer: Medicaid Other | Admitting: Advanced Practice Midwife

## 2019-12-14 DIAGNOSIS — Z3480 Encounter for supervision of other normal pregnancy, unspecified trimester: Secondary | ICD-10-CM

## 2019-12-14 DIAGNOSIS — Z3483 Encounter for supervision of other normal pregnancy, third trimester: Secondary | ICD-10-CM

## 2019-12-14 DIAGNOSIS — Z3A33 33 weeks gestation of pregnancy: Secondary | ICD-10-CM

## 2019-12-14 NOTE — Progress Notes (Signed)
   PRENATAL VISIT NOTE  Subjective:  Lisa Levy is a 28 y.o. G2P1001 at [redacted]w[redacted]d being seen today for ongoing prenatal care.  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  Patient reports no complaints.  Contractions: Irritability. Vag. Bleeding: None.  Movement: Present. Denies leaking of fluid.   The following portions of the patient's history were reviewed and updated as appropriate: allergies, current medications, past family history, past medical history, past social history, past surgical history and problem list.   Objective:   Vitals:   12/14/19 1114  BP: 120/72  Pulse: 97  Weight: 170 lb (77.1 kg)    Fetal Status: Fetal Heart Rate (bpm): 152   Movement: Present     General:  Alert, oriented and cooperative. Patient is in no acute distress.  Skin: Skin is warm and dry. No rash noted.   Cardiovascular: Normal heart rate noted  Respiratory: Normal respiratory effort, no problems with respiration noted  Abdomen: Soft, gravid, appropriate for gestational age.  Pain/Pressure: Absent     Pelvic: Cervical exam deferred        Extremities: Normal range of motion.  Edema: None  Mental Status: Normal mood and affect. Normal behavior. Normal judgment and thought content.   Assessment and Plan:  Pregnancy: G2P1001 at [redacted]w[redacted]d 1. Supervision of other normal pregnancy, antepartum     Doing well      Wanted waterbirth to help her have unmedicated birth      Planning Homebirth with CNM from [place]  Preterm labor symptoms and general obstetric precautions including but not limited to vaginal bleeding, contractions, leaking of fluid and fetal movement were reviewed in detail with the patient. Please refer to After Visit Summary for other counseling recommendations.    Future Appointments  Date Time Provider [place]  12/31/2019 11:00 AM  Truett Mainland, DO [facility] None    Hansel Feinstein, CNM

## 2019-12-14 NOTE — Patient Instructions (Signed)

## 2019-12-29 ENCOUNTER — Ambulatory Visit (INDEPENDENT_AMBULATORY_CARE_PROVIDER_SITE_OTHER): Payer: Medicaid Other | Admitting: Obstetrics & Gynecology

## 2019-12-29 ENCOUNTER — Other Ambulatory Visit: Payer: Self-pay

## 2019-12-29 VITALS — BP 118/73 | HR 93 | Wt 174.0 lb

## 2019-12-29 DIAGNOSIS — Z3480 Encounter for supervision of other normal pregnancy, unspecified trimester: Secondary | ICD-10-CM

## 2019-12-29 DIAGNOSIS — Z3483 Encounter for supervision of other normal pregnancy, third trimester: Secondary | ICD-10-CM

## 2019-12-29 DIAGNOSIS — Z3A30 30 weeks gestation of pregnancy: Secondary | ICD-10-CM

## 2019-12-29 DIAGNOSIS — Z967 Presence of other bone and tendon implants: Secondary | ICD-10-CM

## 2019-12-29 NOTE — Progress Notes (Signed)
   PRENATAL VISIT NOTE  Subjective:  Lisa Levy is a 29 y.o. G2P1001 at [redacted]w[redacted]d being seen today for ongoing prenatal care.  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  Patient reports no complaints.  Contractions: Not present. Vag. Bleeding: None.  Movement: Present. Denies leaking of fluid.   The following portions of the patient's history were reviewed and updated as appropriate: allergies, current medications, past family history, past medical history, past social history, past surgical history and problem list.   Objective:   Vitals:   12/29/19 1516  BP: 118/73  Pulse: 93  Weight: 174 lb (78.9 kg)    Fetal Status: Fetal Heart Rate (bpm): 151   Movement: Present     General:  Alert, oriented and cooperative. Patient is in no acute distress.  Skin: Skin is warm and dry. No rash noted.   Cardiovascular: Normal heart rate noted  Respiratory: Normal respiratory effort, no problems with respiration noted  Abdomen: Soft, gravid, appropriate for gestational age.  Pain/Pressure: Present     Pelvic: Cervical exam deferred        Extremities: Normal range of motion.  Edema: None  Mental Status: Normal mood and affect. Normal behavior. Normal judgment and thought content.   Assessment and Plan:  Pregnancy: G2P1001 at 108w3d 1. Supervision of other normal pregnancy, antepartum +FM  2. 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 Please refer to After Visit Summary for other counseling recommendations.   No follow-ups on file.  No future appointments.  Willodean Rosenthal, MD

## 2019-12-31 ENCOUNTER — Encounter: Payer: Medicaid Other | Admitting: Family Medicine

## 2020-01-13 ENCOUNTER — Other Ambulatory Visit: Payer: Self-pay

## 2020-01-13 ENCOUNTER — Other Ambulatory Visit (HOSPITAL_COMMUNITY)
Admission: RE | Admit: 2020-01-13 | Discharge: 2020-01-13 | Disposition: A | Discharge status: Home / Self Care | Payer: Medicaid Other | Source: Ambulatory Visit | Attending: Family Medicine | Admitting: Family Medicine

## 2020-01-13 ENCOUNTER — Ambulatory Visit (INDEPENDENT_AMBULATORY_CARE_PROVIDER_SITE_OTHER): Payer: Medicaid Other | Admitting: Family Medicine

## 2020-01-13 VITALS — BP 123/75 | HR 110 | Wt 174.0 lb

## 2020-01-13 DIAGNOSIS — Z3480 Encounter for supervision of other normal pregnancy, unspecified trimester: Secondary | ICD-10-CM | POA: Diagnosis present

## 2020-01-13 DIAGNOSIS — Z3483 Encounter for supervision of other normal pregnancy, third trimester: Secondary | ICD-10-CM

## 2020-01-13 DIAGNOSIS — Z3A37 37 weeks gestation of pregnancy: Secondary | ICD-10-CM
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
## 2020-01-14 LAB — GC/CHLAMYDIA PROBE AMP (~~LOC~~) NOT AT ARMC
Chlamydia: NEGATIVE
Comment: NEGATIVE
Comment: NORMAL
Neisseria Gonorrhea: NEGATIVE

## 2020-01-17 LAB — CULTURE, BETA STREP (GROUP B ONLY): Strep Gp B Culture: NEGATIVE

## 2020-01-20 ENCOUNTER — Ambulatory Visit (INDEPENDENT_AMBULATORY_CARE_PROVIDER_SITE_OTHER): Payer: Medicaid Other | Admitting: Family Medicine

## 2020-01-20 ENCOUNTER — Other Ambulatory Visit: Payer: Self-pay

## 2020-01-20 VITALS — BP 112/75 | HR 109 | Wt 176.0 lb

## 2020-01-20 DIAGNOSIS — Z3483 Encounter for supervision of other normal pregnancy, third trimester: Secondary | ICD-10-CM

## 2020-01-20 DIAGNOSIS — Z3A38 38 weeks gestation of pregnancy: Secondary | ICD-10-CM | POA: Diagnosis not present

## 2020-01-20 DIAGNOSIS — Z3480 Encounter for supervision of other normal pregnancy, unspecified trimester: Secondary | ICD-10-CM
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
## 2020-01-28 ENCOUNTER — Encounter: Payer: Medicaid Other | Admitting: Family Medicine

## 2020-03-09 ENCOUNTER — Ambulatory Visit: Payer: Medicaid Other | Admitting: Family Medicine

## 2020-04-07 ENCOUNTER — Other Ambulatory Visit: Payer: Self-pay

## 2020-04-07 ENCOUNTER — Ambulatory Visit (INDEPENDENT_AMBULATORY_CARE_PROVIDER_SITE_OTHER): Payer: Medicaid Other | Admitting: Family Medicine

## 2020-04-07 ENCOUNTER — Encounter: Payer: Self-pay | Admitting: Family Medicine

## 2020-04-07 DIAGNOSIS — Z1389 Encounter for screening for other disorder: Secondary | ICD-10-CM | POA: Diagnosis not present

## 2020-04-07 DIAGNOSIS — Z30430 Encounter for insertion of intrauterine contraceptive device: Secondary | ICD-10-CM

## 2020-04-07 MED ORDER — PARAGARD INTRAUTERINE COPPER IU IUD
INTRAUTERINE_SYSTEM | Freq: Once | INTRAUTERINE | Status: AC
  Administered 2020-04-07: 11:00:00 1 via INTRAUTERINE

## 2020-04-07 NOTE — Patient Instructions (Signed)

## 2020-04-07 NOTE — Progress Notes (Signed)
    Post Partum Visit Note  Lisa Levy is a 29 y.o. G56P1001 female who presents for a postpartum visit. She is 10 weeks postpartum following a normal spontaneous vaginal delivery (home birth 01/27/2020).  I have fully reviewed the prenatal and intrapartum course. The delivery was at 39 gestational weeks.  Anesthesia: none. Postpartum course has been uneventful. Baby is doing well uneventful. Baby is feeding by breast. Bleeding no bleeding. Bowel function is normal. Bladder function is normal. Patient is not sexually active. Contraception method is IUD-Paraguard. Postpartum depression screening: negative. Score-0   Review of Systems Pertinent items are noted in HPI.    Objective:  Blood pressure 113/80, pulse 86, weight 154 lb (69.9 kg), last menstrual period 05/17/2019, currently breastfeeding.  General:  alert, cooperative and no distress  Lungs: clear to auscultation bilaterally  Heart:  regular rate and rhythm, S1, S2 normal, no murmur, click, rub or gallop  Abdomen: soft, non-tender; bowel sounds normal; no masses,  no organomegaly   Vulva:  normal  Vagina: normal vagina, no discharge, exudate, lesion, or erythema  Cervix:  multiparous appearance         IUD Procedure Note Patient identified, informed consent performed, signed copy in chart, time out was performed.  Urine pregnancy test negative.  Speculum placed in the vagina.  Cervix visualized.  Cleaned with Betadine x 2.  Grasped anteriorly with a single tooth tenaculum.  Uterus sounded to 7 cm.  Paragard  IUD placed per manufacturer's recommendations.  Strings trimmed to 3 cm. Tenaculum was removed, good hemostasis noted.  Patient tolerated procedure well.   Patient given post procedure instructions and Paragard care card with expiration date.  Patient is asked to check IUD strings periodically and follow up in 4-6 weeks for IUD check.    Assessment:   postpartum exam. Pap smear not done at today's visit. Last pap 01/13/2018.  WNL  Plan:   Essential components of care per ACOG recommendations:  1.  Mood and well being: Patient with negative depression screening today. Reviewed local resources for support.  - Patient does not use tobacco.  - hx of drug use? No    2. Infant care and feeding:  -Patient currently breastmilk feeding? Yes  -Social determinants of health (SDOH) reviewed in EPIC. No concerns  3. Sexuality, contraception and birth spacing - Patient does not want a pregnancy in the next year.  - Reviewed forms of contraception in tiered fashion. Patient desired IUD today.   - Discussed birth spacing of 18 months  4. Sleep and fatigue -Encouraged family/partner/community support of 4 hrs of uninterrupted sleep to help with mood and fatigue  5. Physical Recovery  - Discussed patients delivery and complications - Patient has urinary incontinence? No - Patient is safe to resume physical and sexual activity  6.  Health Maintenance   Levie Heritage, DO Center for [employer], [facility]

## 2020-05-04 ENCOUNTER — Ambulatory Visit: Payer: Medicaid Other | Admitting: Family Medicine

## 2020-06-26 IMAGING — US US MFM OB DETAIL+14 WK
1 series · 13 of 28 positions shown · non-contrast
Comparison: none

[Series 1: us mfm ob detail+14 wk · 110 acquisitions, 13 frames shown]
[im 5/110]
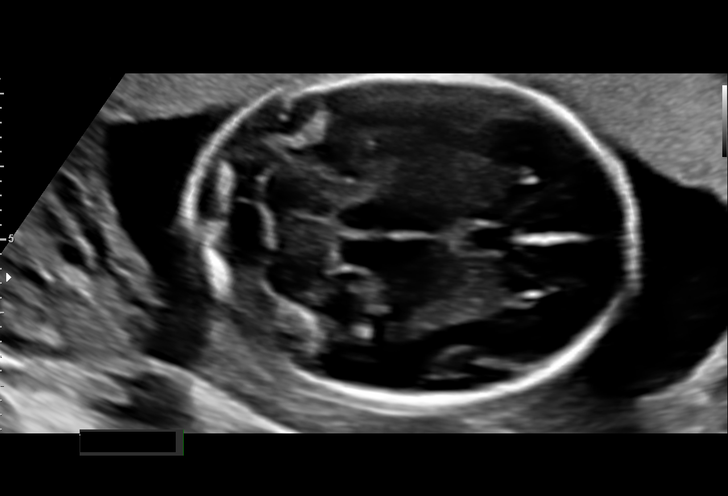
[im 13/110]
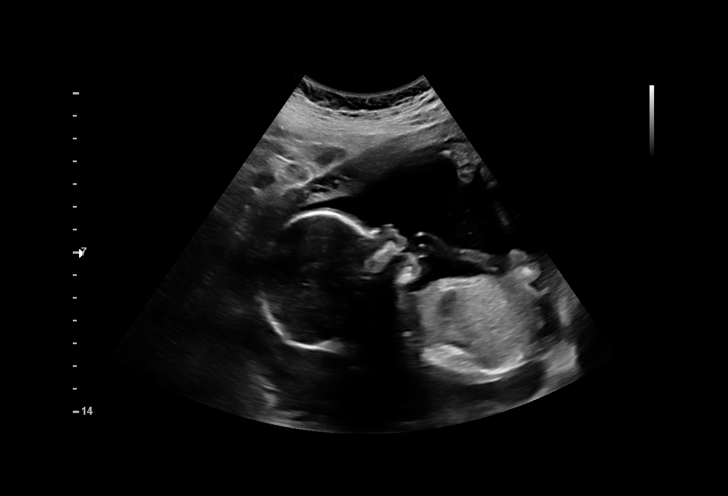
[im 21/110]
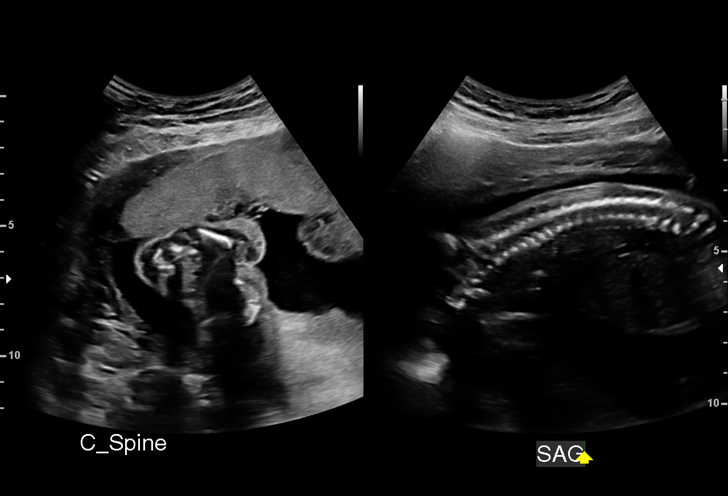
[im 29/110]
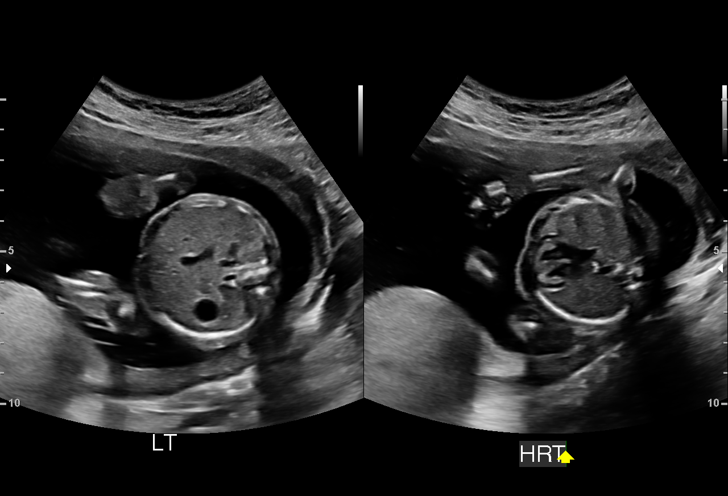
[im 37/110]
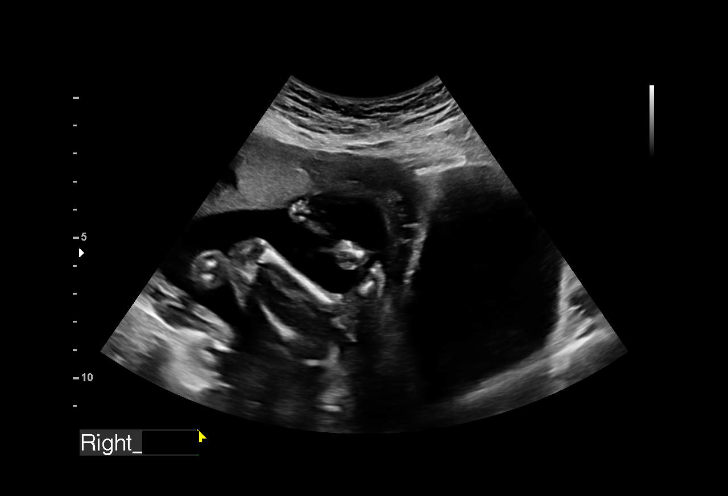
[im 45/110]
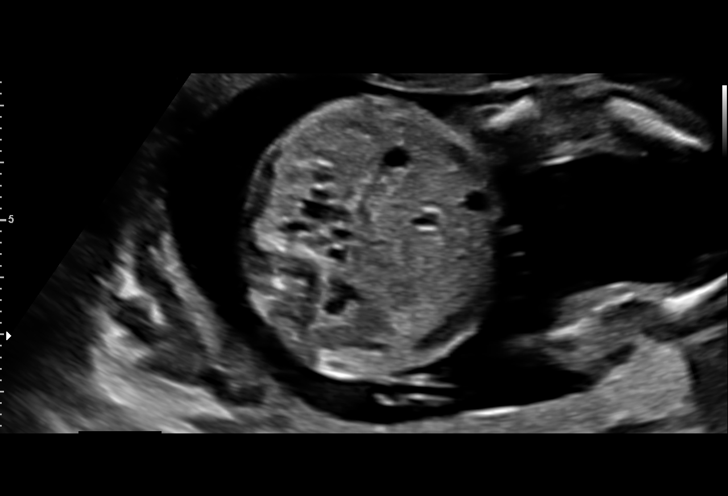
[im 57/110]
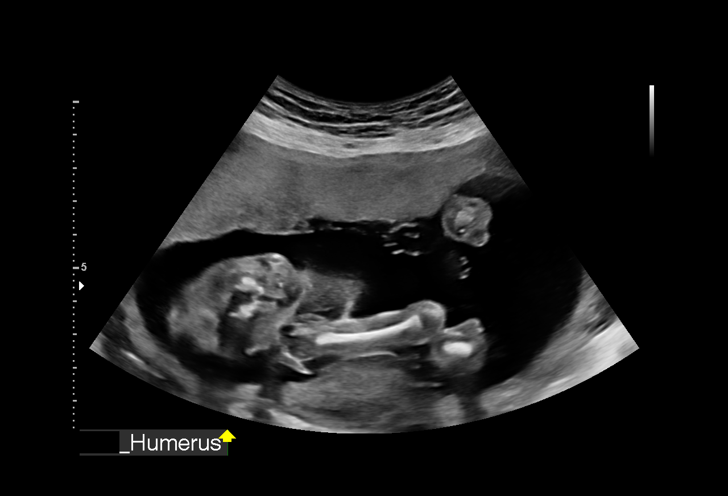
[im 65/110]
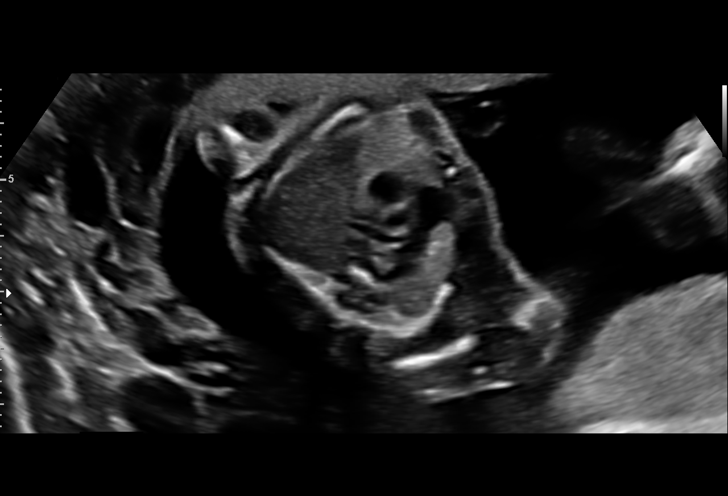
[im 73/110]
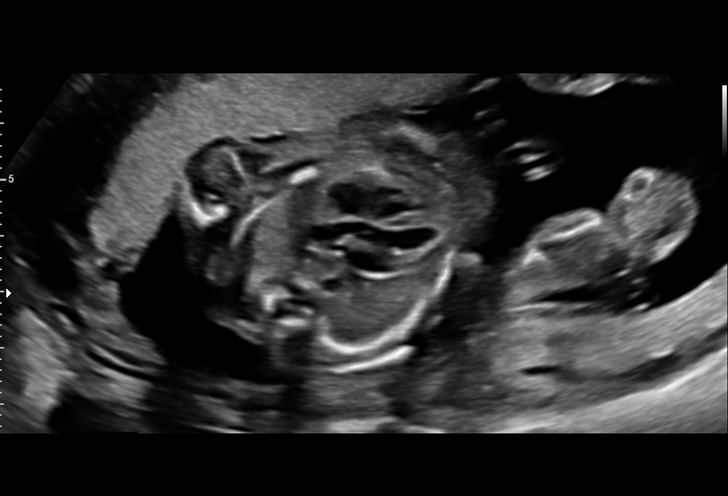
[im 81/110]
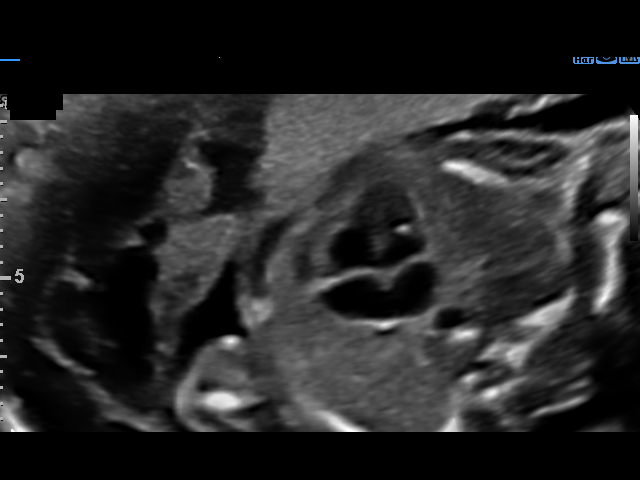
[im 89/110]
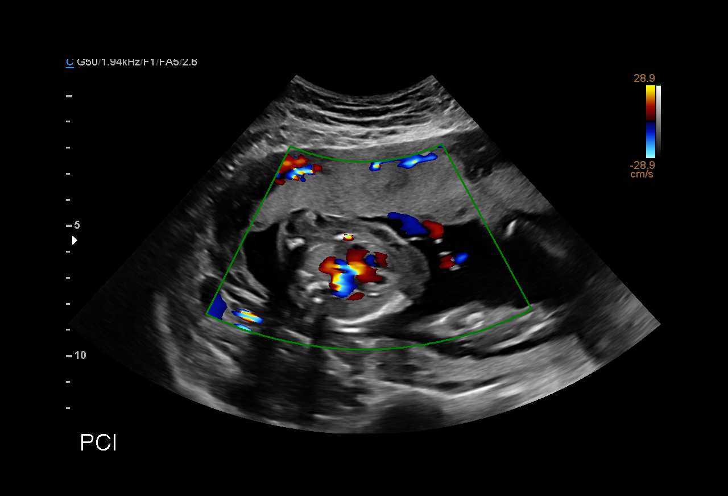
[im 97/110]
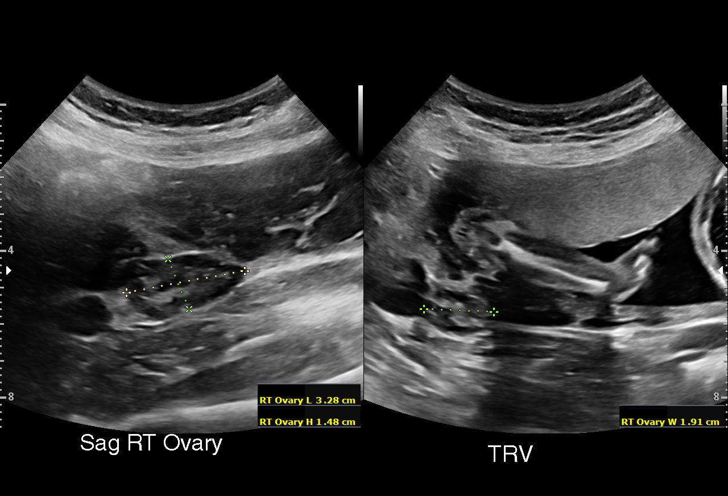
[im 105/110]
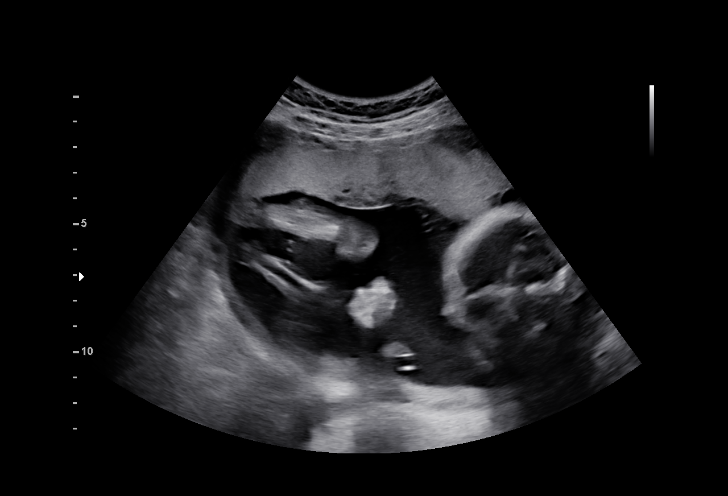

[13 of 28 positions shown; findings below may reference images not displayed]

----------------------------------------------------------------------

 ----------------------------------------------------------------------
Indications

  Fetal abnormality - other known or
  suspected (LVEIF)
  Encounter for antenatal screening for
  malformations
  20 weeks gestation of pregnancy
 ----------------------------------------------------------------------
Fetal Evaluation

 Num Of Fetuses:         1
 Fetal Heart Rate(bpm):  150
 Cardiac Activity:       Observed
 Presentation:           Breech
 Placenta:               Anterior
 P. Cord Insertion:      Visualized

 Amniotic Fluid
 AFI FV:      Within normal limits

                             Largest Pocket(cm)

Biometry

 BPD:      44.6  mm     G. Age:  19w 3d         15  %    CI:        66.32   %    70 - 86
                                                         FL/HC:      17.7   %    16.8 -
 HC:      175.6  mm     G. Age:  20w 0d         26  %    HC/AC:      1.14        1.09 -
 AC:      154.3  mm     G. Age:  20w 4d         50  %    FL/BPD:     69.5   %
 FL:         31  mm     G. Age:  19w 4d         16  %    FL/AC:      20.1   %    20 - 24
 HUM:      30.4  mm     G. Age:  20w 0d         40  %
 CER:      20.5  mm     G. Age:  19w 4d         29  %
 LV:        4.9  mm
 CM:        5.5  mm
 Est. FW:     333  gm    0 lb 12 oz      28  %
OB History

 Gravidity:    2         Term:   1
 Living:       1
Gestational Age

 LMP:           17w 2d        Date:  05/17/19                 EDD:   02/21/20
 U/S Today:     19w 6d                                        EDD:   02/03/20
 Best:          20w 3d     Det. By:  Early Ultrasound         EDD:   01/30/20
                                     (07/01/19)
Anatomy

 Cranium:               Appears normal         LVOT:                   Appears normal
 Cavum:                 Appears normal         Aortic Arch:            Appears normal
 Ventricles:            Appears normal         Ductal Arch:            Appears normal
 Choroid Plexus:        Appears normal         Diaphragm:              Appears normal
 Cerebellum:            Appears normal         Stomach:                Appears normal, left
                                                                       sided
 Posterior Fossa:       Appears normal         Abdomen:                Appears normal
 Nuchal Fold:           Not applicable (>20    Abdominal Wall:         Appears nml (cord
                        wks GA)                                        insert, abd wall)
 Face:                  Appears normal         Cord Vessels:           Appears normal (3
                        (orbits and profile)                           vessel cord)
 Lips:                  Appears normal         Kidneys:                Appear normal
 Palate:                Not well visualized    Bladder:                Appears normal
 Thoracic:              Appears normal         Spine:                  Appears normal
 Heart:                 Echogenic focus        Upper Extremities:      Appears normal
                        in LV
 RVOT:                  Appears normal         Lower Extremities:      Appears normal

 Other:  Parents do not wish to know sex of fetus. Nasal bone visualized.
         Heels visualized. Technically difficult due to fetal position and
         movement.
Cervix Uterus Adnexa

 Cervix
 Length:           4.89  cm.
 Normal appearance by transabdominal scan.

 Uterus
 No abnormality visualized.

 Left Ovary
 Size(cm)       3.4  x   1.9    x  2.1       Vol(ml):
 Within normal limits.

 Right Ovary
 Size(cm)       3.3  x   1.5    x  1.9       Vol(ml):
 Within normal limits.
Comments

 This patient was seen for a detailed fetal anatomy scan. She
 denies any significant past medical history and denies any
 problems in her current pregnancy.
 The patient has not had any screening tests for detection of
 fetal aneuploidy drawn in her current pregnancy.
 She was informed that the fetal growth and amniotic fluid
 level were appropriate for her gestational age.
 On today's exam, an intracardiac echogenic focus was noted
 in the left ventricle of the fetal heart.  The small association
 between an echogenic focus and Down syndrome was
 discussed. Due to the echogenic focus noted today, the
 patient was offered and declined an amniocentesis today for
 definitive diagnosis of fetal aneuploidy.  She was also offered
 and declined a cell free DNA test for screening for fetal
 aneuploidy.  She stated that an aneuploid fetus would not
 change the outcome of her current pregnancy.
 The patient was informed that anomalies may be missed due
 to technical limitations. If the fetus is in a suboptimal position
 or maternal habitus is increased, visualization of the fetus in
 the maternal uterus may be impaired.
 Follow up as indicated.

## 2020-09-27 ENCOUNTER — Other Ambulatory Visit: Payer: Self-pay | Admitting: Family Medicine

## 2021-12-03 DIAGNOSIS — F439 Reaction to severe stress, unspecified: Secondary | ICD-10-CM | POA: Diagnosis not present

## 2021-12-11 DIAGNOSIS — F439 Reaction to severe stress, unspecified: Secondary | ICD-10-CM | POA: Diagnosis not present

## 2022-06-21 DIAGNOSIS — N390 Urinary tract infection, site not specified: Secondary | ICD-10-CM | POA: Diagnosis not present

## 2022-07-10 ENCOUNTER — Other Ambulatory Visit (HOSPITAL_COMMUNITY)
Admission: RE | Admit: 2022-07-10 | Discharge: 2022-07-10 | Disposition: A | Discharge status: Home / Self Care | Payer: Medicaid Other | Source: Ambulatory Visit | Attending: Obstetrics & Gynecology | Admitting: Obstetrics & Gynecology

## 2022-07-10 ENCOUNTER — Encounter: Payer: Self-pay | Admitting: Obstetrics & Gynecology

## 2022-07-10 ENCOUNTER — Ambulatory Visit: Payer: Medicaid Other | Admitting: Obstetrics & Gynecology

## 2022-07-10 VITALS — BP 117/74 | HR 97 | Ht 64.0 in | Wt 138.0 lb

## 2022-07-10 DIAGNOSIS — Z01419 Encounter for gynecological examination (general) (routine) without abnormal findings: Secondary | ICD-10-CM | POA: Diagnosis present

## 2022-07-10 DIAGNOSIS — Z0000 Encounter for general adult medical examination without abnormal findings: Secondary | ICD-10-CM | POA: Diagnosis not present

## 2022-07-10 DIAGNOSIS — Z975 Presence of (intrauterine) contraceptive device: Secondary | ICD-10-CM | POA: Insufficient documentation
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
## 2022-07-11 LAB — CYTOLOGY - PAP: Diagnosis: NEGATIVE

## 2022-08-15 ENCOUNTER — Ambulatory Visit: Payer: Medicaid Other | Admitting: Family Medicine

## 2022-08-21 ENCOUNTER — Ambulatory Visit (INDEPENDENT_AMBULATORY_CARE_PROVIDER_SITE_OTHER): Payer: Medicaid Other | Admitting: Family Medicine

## 2022-08-21 VITALS — BP 112/64 | HR 73 | Ht 64.0 in

## 2022-08-21 DIAGNOSIS — L723 Sebaceous cyst: Secondary | ICD-10-CM | POA: Diagnosis not present

## 2022-08-21 NOTE — Progress Notes (Signed)
Boil on inner thigh has not gone away. Armandina Stammer  RN

## 2022-08-21 NOTE — Progress Notes (Signed)
   Subjective:    Patient ID: Lisa Levy, female    DOB: [birth year], 31 y.o.   MRN: [id number]  HPI Patient seen for boil on her right inguinal crease that has been coming and going for the past several months.  Gets larger, gets inflamed then breaks and drains.  Never truly goes away.  Currently the area is okay, but still has small bump.   Review of Systems     Objective:   Physical Exam Constitutional:      Appearance: Normal appearance.  Genitourinary:   Neurological:     Mental Status: She is alert.        Assessment & Plan:  1. Sebaceous cyst As the area has been inflamed off and on for the past several months, will remove.  We will get the patient scheduled

## 2022-09-12 ENCOUNTER — Ambulatory Visit (INDEPENDENT_AMBULATORY_CARE_PROVIDER_SITE_OTHER): Payer: Medicaid Other | Admitting: Family Medicine

## 2022-09-12 ENCOUNTER — Encounter: Payer: Self-pay | Admitting: Family Medicine

## 2022-09-12 VITALS — BP 111/66 | HR 82 | Wt 139.0 lb

## 2022-09-12 DIAGNOSIS — N907 Vulvar cyst: Secondary | ICD-10-CM

## 2022-09-12 DIAGNOSIS — L723 Sebaceous cyst: Secondary | ICD-10-CM

## 2022-09-12 NOTE — Progress Notes (Signed)
Sebaceous cyst excision  After time out, cyst on the right vulva was cleaned with betadine and injected with 15mL of 2% lidocaine with epinephrine. A 1.5cm incision was made over the sebaceous cyst down to the cyst wall. Using scissors, the cyst wall was excised from the surrounding tissue and removed. 3 interrupted sutures placed with 3-0 vicryl.  Antibiotic ointment placed on the area. Instructions given to keep area clean and dry. Return in 1 week for check and suture removal.

## 2022-09-19 ENCOUNTER — Ambulatory Visit: Payer: Medicaid Other | Admitting: Family Medicine

## 2023-03-03 ENCOUNTER — Ambulatory Visit (INDEPENDENT_AMBULATORY_CARE_PROVIDER_SITE_OTHER): Payer: Medicaid Other | Admitting: Family

## 2023-03-03 ENCOUNTER — Encounter (INDEPENDENT_AMBULATORY_CARE_PROVIDER_SITE_OTHER): Payer: Self-pay

## 2023-03-03 ENCOUNTER — Encounter: Payer: Self-pay | Admitting: Family

## 2023-03-03 VITALS — BP 115/72 | HR 72 | Temp 97.9°F | Resp 16 | Ht 64.0 in | Wt 142.0 lb

## 2023-03-03 DIAGNOSIS — B351 Tinea unguium: Secondary | ICD-10-CM | POA: Diagnosis not present

## 2023-03-03 DIAGNOSIS — L989 Disorder of the skin and subcutaneous tissue, unspecified: Secondary | ICD-10-CM | POA: Diagnosis not present

## 2023-03-03 DIAGNOSIS — Z5181 Encounter for therapeutic drug level monitoring: Secondary | ICD-10-CM | POA: Diagnosis not present

## 2023-03-03 LAB — HEPATIC FUNCTION PANEL
ALT: 11 U/L (ref 0–35)
AST: 14 U/L (ref 0–37)
Albumin: 4.2 g/dL (ref 3.5–5.2)
Alkaline Phosphatase: 38 U/L — ABNORMAL LOW (ref 39–117)
Bilirubin, Direct: 0.1 mg/dL (ref 0.0–0.3)
Total Bilirubin: 0.3 mg/dL (ref 0.2–1.2)
Total Protein: 6.5 g/dL (ref 6.0–8.3)

## 2023-03-03 NOTE — Assessment & Plan Note (Signed)
Toenail clipping will be sent for culture to confirm fungus.  If + will plan lamisil x 12 weeks.  Check baseline LFT's.
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
## 2023-03-03 NOTE — Assessment & Plan Note (Signed)
Will schedule mole removal for 2 lesions.

## 2023-03-05 LAB — CULT, FUNGUS, SKIN,HAIR,NAIL W/KOH

## 2023-03-07 ENCOUNTER — Telehealth: Payer: Self-pay | Admitting: Family

## 2023-03-07 DIAGNOSIS — B351 Tinea unguium: Secondary | ICD-10-CM

## 2023-03-07 LAB — CULT, FUNGUS, SKIN,HAIR,NAIL W/KOH
SMEAR:: NONE SEEN
SPECIMEN QUALITY:: ADEQUATE

## 2023-03-07 MED ORDER — TERBINAFINE HCL 250 MG PO TABS: 250.0000 mg | ORAL_TABLET | Freq: Every day | ORAL | 0 refills | Status: DC

## 2023-03-07 NOTE — Telephone Encounter (Signed)
See mychart.  

## 2023-03-17 ENCOUNTER — Ambulatory Visit: Payer: Medicaid Other | Admitting: Family

## 2023-03-17 VITALS — BP 112/76 | HR 106 | Temp 99.0°F | Resp 16 | Wt 146.0 lb

## 2023-03-17 DIAGNOSIS — L989 Disorder of the skin and subcutaneous tissue, unspecified: Secondary | ICD-10-CM | POA: Diagnosis not present

## 2023-03-17 DIAGNOSIS — F32A Depression, unspecified: Secondary | ICD-10-CM | POA: Diagnosis not present

## 2023-03-17 DIAGNOSIS — D224 Melanocytic nevi of scalp and neck: Secondary | ICD-10-CM | POA: Diagnosis not present

## 2023-03-17 LAB — CULT, FUNGUS, SKIN,HAIR,NAIL W/KOH: MICRO NUMBER:: 14674931
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
## 2023-03-17 NOTE — Assessment & Plan Note (Signed)
Having a lot of stress in her life right now.  I encouraged her to establish with a counselor and let us know if her symptoms worsen or fail to improve with counseling.

## 2023-03-17 NOTE — Assessment & Plan Note (Signed)
Procedure including risks/benefits explained to patient.  Questions were answered. After informed consent was obtained and a time out completed, site was cleansed with betadine. 2% Lidocaine was injected under lesion and then shave removal.  was performed. Area was cauterized to obtain hemostasis. Specimen sent for pathology review.  Pt instructed to keep the area dry for 24 hours and to contact us if he develops redness, drainage or swelling at the site.    Patient had an episode of vasovagal syncope response following removal of skin lesion.  She had a brief LOC 5-10 seconds and then gained full consciousness. She reports that she has previous hx of vasovagal syncope with blood draws.  She was given water and juice and allowed to rest. She reported feeling well, 100% after resting and was permitted to leave.

## 2023-03-17 NOTE — Addendum Note (Signed)
Addended by: Jiles Prows on: 03/17/2023 01:14 PM   Modules accepted: Orders

## 2023-08-13 ENCOUNTER — Ambulatory Visit (INDEPENDENT_AMBULATORY_CARE_PROVIDER_SITE_OTHER): Payer: Medicaid Other | Admitting: Family

## 2023-08-13 VITALS — BP 101/59 | HR 81 | Temp 99.2°F | Resp 16 | Wt 149.0 lb

## 2023-08-13 DIAGNOSIS — F32A Depression, unspecified: Secondary | ICD-10-CM

## 2023-08-13 DIAGNOSIS — B351 Tinea unguium: Secondary | ICD-10-CM

## 2023-08-13 MED ORDER — TERBINAFINE HCL 250 MG PO TABS: 250.0000 mg | ORAL_TABLET | Freq: Every day | ORAL | 0 refills | Status: DC
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
## 2023-08-14 NOTE — Patient Instructions (Signed)
VISIT SUMMARY:  During your visit, we discussed your concerns about the discoloration and thickening of your toenails, specifically on your right big toe and left pinky toe. We also discussed your mental health concerns and the need for counseling services. We have outlined a plan to address both these issues.  YOUR PLAN:  -TOENAIL DISCOLORATION: The discoloration and thickening of your toenails is due to a condition called onychomycosis, which is a fungal infection. We will start you on a medication called Lamisil, which you will take once daily for 12 weeks. We will also monitor your liver function during this treatment.  -DEPRESSION: Your responses on the depression questionnaire indicate that you may be experiencing symptoms of depression. We will provide you with referral information for counseling services to help manage these symptoms.  INSTRUCTIONS:  Please start taking Lamisil once daily for 12 weeks. We will repeat your liver function tests in 4 weeks. If the results are normal, you should continue taking Lamisil for the remaining 8 weeks. We will also provide you with referral information for counseling services. Please schedule a physical examination and a follow-up appointment in 3 months to assess the completion of your antifungal treatment and check the improvement of your toenails.

## 2023-08-14 NOTE — Assessment & Plan Note (Signed)
  Chronic discoloration and thickening of right big toe and left pinky toe. Prior treatment with oral antifungal medication (likely terbinafine) had temporary effect. Liver function tests were normal at last check. -Start Lamisil once daily for 12 weeks. -Repeat liver function tests in 4 weeks. If normal, continue Lamisil for the remaining 8 weeks.

## 2023-08-14 NOTE — Assessment & Plan Note (Signed)
Overall reports fair control but really feels like talking to a therapist would be helpful. I gave her information to call [facility] for an appointment.

## 2023-08-15 ENCOUNTER — Ambulatory Visit: Payer: Medicaid Other | Admitting: Family

## 2023-10-01 DIAGNOSIS — F4310 Post-traumatic stress disorder, unspecified: Secondary | ICD-10-CM | POA: Diagnosis not present

## 2023-10-09 DIAGNOSIS — F4310 Post-traumatic stress disorder, unspecified: Secondary | ICD-10-CM | POA: Diagnosis not present

## 2023-10-16 DIAGNOSIS — F4310 Post-traumatic stress disorder, unspecified: Secondary | ICD-10-CM | POA: Diagnosis not present

## 2023-11-14 ENCOUNTER — Ambulatory Visit (INDEPENDENT_AMBULATORY_CARE_PROVIDER_SITE_OTHER): Payer: Medicaid Other | Admitting: Family

## 2023-11-14 ENCOUNTER — Encounter: Payer: Self-pay | Admitting: Family

## 2023-11-14 VITALS — BP 113/73 | HR 76 | Temp 98.4°F | Resp 16 | Ht 64.0 in | Wt 151.0 lb

## 2023-11-14 DIAGNOSIS — Z0000 Encounter for general adult medical examination without abnormal findings: Secondary | ICD-10-CM

## 2023-11-14 DIAGNOSIS — B351 Tinea unguium: Secondary | ICD-10-CM

## 2023-11-14 DIAGNOSIS — Z0001 Encounter for general adult medical examination with abnormal findings: Secondary | ICD-10-CM

## 2023-11-14 LAB — COMPREHENSIVE METABOLIC PANEL
ALT: 11 U/L (ref 0–35)
AST: 15 U/L (ref 0–37)
Albumin: 4.5 g/dL (ref 3.5–5.2)
Alkaline Phosphatase: 43 U/L (ref 39–117)
BUN: 9 mg/dL (ref 6–23)
CO2: 30 meq/L (ref 19–32)
Calcium: 9.2 mg/dL (ref 8.4–10.5)
Chloride: 104 meq/L (ref 96–112)
Creatinine, Ser: 0.72 mg/dL (ref 0.40–1.20)
GFR: 112 mL/min (ref >60.00)
Glucose, Bld: 83 mg/dL (ref 70–99)
Potassium: 4.4 meq/L (ref 3.5–5.1)
Sodium: 138 meq/L (ref 135–145)
Total Bilirubin: 0.4 mg/dL (ref 0.2–1.2)
Total Protein: 6.3 g/dL (ref 6.0–8.3)

## 2023-11-14 LAB — LIPID PANEL
Cholesterol: 133 mg/dL (ref 0–200)
HDL: 52.8 mg/dL (ref >39.00)
LDL Cholesterol: 67 mg/dL (ref 0–99)
NonHDL: 80.12
Total CHOL/HDL Ratio: 3
Triglycerides: 65 mg/dL (ref 0.0–149.0)
VLDL: 13 mg/dL (ref 0.0–40.0)

## 2023-11-14 MED ORDER — TERBINAFINE HCL 250 MG PO TABS: 250.0000 mg | ORAL_TABLET | Freq: Every day | ORAL | 0 refills | Status: DC
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
## 2023-11-14 NOTE — Assessment & Plan Note (Signed)
-  Encouraged to maintain healthy diet and regular exercise. -Next Pap smear due July 2026. -Encouraged to schedule a vision exam. -Order cholesterol panel and liver function tests. -Plan to check back in a year unless issues arise.

## 2023-11-14 NOTE — Patient Instructions (Signed)
VISIT SUMMARY:  During today's visit, we discussed your ongoing toenail fungus and the importance of consistent medication use. We also addressed your occasional headaches and reviewed your general health maintenance.  YOUR PLAN:  -ONYCHOMYCOSIS (TOENAIL FUNGUS): Onychomycosis is a fungal infection of the toenails. It is important to take Lamisil consistently for a full twelve-week course to see improvement. We will also need to check your liver function in 30 days to ensure the medication is safe for you. Please resume taking Lamisil as prescribed and we will send a refill to [facility] [address].  -HEADACHES: Your headaches may be related to dehydration. It is important to stay well-hydrated, especially during work hours. Continue to manage with water and ibuprofen as needed.  -GENERAL HEALTH MAINTENANCE: Continue maintaining a healthy diet and regular exercise. Your next Pap smear is due in July 2026. Please schedule a vision exam soon. We will also order a cholesterol panel and liver function tests. Plan to check back in a year unless any issues arise.  INSTRUCTIONS:  Please take Lamisil consistently for the next 30 days and come back for liver function tests in 4 weeks. Schedule a vision exam and follow up with [place] in a year unless you have any new concerns.

## 2023-11-14 NOTE — Assessment & Plan Note (Addendum)
  Inconsistent use of Lamisil with no noticeable improvement in nail appearance. Discussed the importance of consistent use for a full twelve-week course and the need for liver function tests at the thirty-day mark. -Resume Lamisil and take consistently for 30 days. -Repeat liver function tests in 4 weeks. -[facility] to [facility] [address].

## 2024-02-10 DIAGNOSIS — R051 Acute cough: Secondary | ICD-10-CM | POA: Diagnosis not present

## 2024-02-10 DIAGNOSIS — R070 Pain in throat: Secondary | ICD-10-CM | POA: Diagnosis not present

## 2024-02-10 DIAGNOSIS — R6883 Chills (without fever): Secondary | ICD-10-CM | POA: Diagnosis not present

## 2024-02-10 DIAGNOSIS — M791 Myalgia, unspecified site: Secondary | ICD-10-CM | POA: Diagnosis not present

## 2024-02-10 DIAGNOSIS — J029 Acute pharyngitis, unspecified: Secondary | ICD-10-CM | POA: Diagnosis not present

## 2024-12-07 ENCOUNTER — Ambulatory Visit: Payer: Self-pay

## 2024-12-07 NOTE — Telephone Encounter (Signed)
 Appt scheduled

## 2024-12-07 NOTE — Telephone Encounter (Signed)
 FYI Only or Action Required?: FYI only for provider: appointment scheduled on 12/08/24 with Dr. Watt d/t appt availability.  Patient was last seen in primary care on 11/14/2023 by Daryl Setter, NP.  Called Nurse Triage reporting Foot Swelling.  Symptoms began several days ago.  Interventions attempted: Other: elevation.  Symptoms are: unchanged.  Triage Disposition: See PCP When Office is Open (Within 3 Days)  Patient/caregiver understands and will follow disposition?: Yes  Copied from CRM #[id number]. Topic: Clinical - Red Word Triage >> Dec 07, 2024  3:33 PM Dedra B wrote: Red Word that prompted transfer to Nurse Triage: Pt having swelling in R foot. Warm transfer to NT. Reason for Disposition  [1] MILD swelling of both ankles (i.e., pedal edema) AND [2] new-onset or getting worse  Answer Assessment - Initial Assessment Questions Pt reports increased R foot swelling since Saturday, 12/13; pt reports hx of swelling but never to this extent. Pt states swelling is always R-sided; denies hx of blood clots, no chest pain or SOB. Pt states she works 8-5 on her feet, on a concrete floor so she tries to elevate when she gets home. Pt reports today, increase in pain with ambulation but manageable. Appointment scheduled for evaluation. Patient agrees with plan of care, and will call back if anything changes, or if symptoms worsen.     1. ONSET: When did the swelling start? (e.g., minutes, hours, days)     Saturday   2. LOCATION: What part of the leg is swollen?  Are both legs swollen or just one leg?     R foot; unilateral  3. SEVERITY: How bad is the swelling? (e.g., localized; mild, moderate, severe)      Localized to R foot; double the size of L foot   4. REDNESS: Is there redness or signs of infection?     Minimal red tent   5. PAIN: Is the swelling painful to touch? If Yes, ask: How painful is it?   (Scale 1-10; mild, moderate or severe)     With walking,  discomfort; 7/10  6. FEVER: Do you have a fever? If Yes, ask: What is it, how was it measured, and when did it start?      No; skin is warm to touch   7. CAUSE: What do you think is causing the leg swelling?     Unknown   8. MEDICAL HISTORY: Do you have a history of blood clots (e.g., DVT), cancer, heart failure, kidney disease, or liver failure?     None   9. RECURRENT SYMPTOM: Have you had leg swelling before? If Yes, ask: When was the last time? What happened that time?     Yes 6 years ago but not to this extent   10. OTHER SYMPTOMS: Do you have any other symptoms? (e.g., chest pain, difficulty breathing)       No  Protocols used: Leg Swelling and Edema-A-AH
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
## 2024-12-08 ENCOUNTER — Encounter: Payer: Self-pay | Admitting: Family Medicine

## 2024-12-08 ENCOUNTER — Ambulatory Visit: Admitting: Family Medicine

## 2024-12-08 VITALS — BP 112/66 | HR 87 | Ht 64.0 in | Wt 144.0 lb

## 2024-12-08 DIAGNOSIS — M79671 Pain in right foot: Secondary | ICD-10-CM

## 2024-12-08 DIAGNOSIS — G8929 Other chronic pain: Secondary | ICD-10-CM

## 2024-12-08 DIAGNOSIS — B351 Tinea unguium: Secondary | ICD-10-CM | POA: Diagnosis not present

## 2024-12-08 LAB — COMPREHENSIVE METABOLIC PANEL WITH GFR
ALT: 9 U/L (ref 3–35)
AST: 11 U/L (ref 5–37)
Albumin: 4.7 g/dL (ref 3.5–5.2)
Alkaline Phosphatase: 40 U/L (ref 39–117)
BUN: 13 mg/dL (ref 6–23)
CO2: 30 meq/L (ref 19–32)
Calcium: 9.2 mg/dL (ref 8.4–10.5)
Chloride: 102 meq/L (ref 96–112)
Creatinine, Ser: 0.76 mg/dL (ref 0.40–1.20)
GFR: 104.18 mL/min (ref >60.00)
Glucose, Bld: 90 mg/dL (ref 70–99)
Potassium: 4.5 meq/L (ref 3.5–5.1)
Sodium: 136 meq/L (ref 135–145)
Total Bilirubin: 0.4 mg/dL (ref 0.2–1.2)
Total Protein: 6.8 g/dL (ref 6.0–8.3)

## 2024-12-08 LAB — CBC
HCT: 40.2 % (ref 36.0–46.0)
Hemoglobin: 13.4 g/dL (ref 12.0–15.0)
MCHC: 33.4 g/dL (ref 30.0–36.0)
MCV: 95.1 fl (ref 78.0–100.0)
Platelets: 260 K/uL (ref 150.0–400.0)
RBC: 4.22 Mil/uL (ref 3.87–5.11)
RDW: 14.1 % (ref 11.5–15.5)
WBC: 6.7 K/uL (ref 4.0–10.5)

## 2024-12-08 MED ORDER — TERBINAFINE HCL 250 MG PO TABS: 250.0000 mg | ORAL_TABLET | Freq: Every day | ORAL | 0 refills | Status: AC

## 2024-12-08 NOTE — Patient Instructions (Signed)
 Good to se you- I will get you set up with a foot and ankle specialist to check on your feet In the meantime a good pair of athletic shoes- like [employer]- for work may help!   Terbinafine  250 mg daily for 12 weeks/ 90 days for toenail fungus
